# Patient Record
Sex: Female | Born: 1935 | Race: White | Hispanic: No | Marital: Single | Smoking: Never smoker
Health system: Southern US, Community
[De-identification: ages and names within clinical notes are randomized; demographics above are authoritative.]

## PROBLEM LIST (undated history)

## (undated) DIAGNOSIS — I10 Essential (primary) hypertension: Secondary | ICD-10-CM

## (undated) DIAGNOSIS — R42 Dizziness and giddiness: Secondary | ICD-10-CM

## (undated) DIAGNOSIS — J449 Chronic obstructive pulmonary disease, unspecified: Secondary | ICD-10-CM

## (undated) DIAGNOSIS — N189 Chronic kidney disease, unspecified: Secondary | ICD-10-CM

## (undated) DIAGNOSIS — F32A Depression, unspecified: Secondary | ICD-10-CM

## (undated) DIAGNOSIS — F329 Major depressive disorder, single episode, unspecified: Secondary | ICD-10-CM

## (undated) DIAGNOSIS — G2581 Restless legs syndrome: Secondary | ICD-10-CM

## (undated) DIAGNOSIS — I509 Heart failure, unspecified: Secondary | ICD-10-CM

## (undated) DIAGNOSIS — I38 Endocarditis, valve unspecified: Secondary | ICD-10-CM

## (undated) DIAGNOSIS — I4891 Unspecified atrial fibrillation: Secondary | ICD-10-CM

## (undated) DIAGNOSIS — F0390 Unspecified dementia without behavioral disturbance: Secondary | ICD-10-CM

## (undated) DIAGNOSIS — I2510 Atherosclerotic heart disease of native coronary artery without angina pectoris: Secondary | ICD-10-CM

## (undated) DIAGNOSIS — E785 Hyperlipidemia, unspecified: Secondary | ICD-10-CM

## (undated) DIAGNOSIS — Z950 Presence of cardiac pacemaker: Secondary | ICD-10-CM

## (undated) HISTORY — PX: PACEMAKER INSERTION: SHX728

---

## 2016-01-02 ENCOUNTER — Encounter (HOSPITAL_COMMUNITY): Payer: Self-pay | Admitting: Emergency Medicine

## 2016-01-02 ENCOUNTER — Emergency Department (HOSPITAL_COMMUNITY)
Admission: EM | Admit: 2016-01-02 | Discharge: 2016-01-03 | Disposition: A | Discharge status: Home / Self Care | Payer: Medicare Other | Attending: Emergency Medicine | Admitting: Emergency Medicine

## 2016-01-02 DIAGNOSIS — Z950 Presence of cardiac pacemaker: Secondary | ICD-10-CM | POA: Diagnosis not present

## 2016-01-02 DIAGNOSIS — Z7901 Long term (current) use of anticoagulants: Secondary | ICD-10-CM | POA: Diagnosis not present

## 2016-01-02 DIAGNOSIS — E876 Hypokalemia: Secondary | ICD-10-CM | POA: Diagnosis not present

## 2016-01-02 DIAGNOSIS — R197 Diarrhea, unspecified: Secondary | ICD-10-CM | POA: Diagnosis present

## 2016-01-02 DIAGNOSIS — E860 Dehydration: Secondary | ICD-10-CM | POA: Diagnosis not present

## 2016-01-02 DIAGNOSIS — Z79899 Other long term (current) drug therapy: Secondary | ICD-10-CM | POA: Diagnosis not present

## 2016-01-02 DIAGNOSIS — N390 Urinary tract infection, site not specified: Secondary | ICD-10-CM | POA: Diagnosis not present

## 2016-01-02 LAB — URINALYSIS, ROUTINE W REFLEX MICROSCOPIC
Bilirubin Urine: NEGATIVE
GLUCOSE, UA: NEGATIVE mg/dL
KETONES UR: NEGATIVE mg/dL
Nitrite: POSITIVE — AB
PH: 6 (ref 5.0–8.0)
Protein, ur: 100 mg/dL — AB
Specific Gravity, Urine: 1.02 (ref 1.005–1.030)

## 2016-01-02 LAB — CBC WITH DIFFERENTIAL/PLATELET
BASOS ABS: 0 10*3/uL (ref 0.0–0.1)
BASOS PCT: 0 %
EOS ABS: 0 10*3/uL (ref 0.0–0.7)
Eosinophils Relative: 0 %
HEMATOCRIT: 35.8 % — AB (ref 36.0–46.0)
Hemoglobin: 12.2 g/dL (ref 12.0–15.0)
Lymphocytes Relative: 26 %
Lymphs Abs: 0.9 10*3/uL (ref 0.7–4.0)
MCH: 31.6 pg (ref 26.0–34.0)
MCHC: 34.1 g/dL (ref 30.0–36.0)
MCV: 92.7 fL (ref 78.0–100.0)
MONOS PCT: 21 %
Monocytes Absolute: 0.8 10*3/uL (ref 0.1–1.0)
NEUTROS ABS: 1.9 10*3/uL (ref 1.7–7.7)
NEUTROS PCT: 53 %
Platelets: 125 10*3/uL — ABNORMAL LOW (ref 150–400)
RBC: 3.86 MIL/uL — AB (ref 3.87–5.11)
RDW: 14.8 % (ref 11.5–15.5)
WBC: 3.6 10*3/uL — AB (ref 4.0–10.5)

## 2016-01-02 LAB — URINE MICROSCOPIC-ADD ON: Squamous Epithelial / LPF: NONE SEEN

## 2016-01-02 LAB — COMPREHENSIVE METABOLIC PANEL
ALBUMIN: 3.6 g/dL (ref 3.5–5.0)
ALK PHOS: 31 U/L — AB (ref 38–126)
ALT: 12 U/L — ABNORMAL LOW (ref 14–54)
ANION GAP: 11 (ref 5–15)
AST: 27 U/L (ref 15–41)
BILIRUBIN TOTAL: 0.5 mg/dL (ref 0.3–1.2)
BUN: 25 mg/dL — AB (ref 6–20)
CALCIUM: 8.7 mg/dL — AB (ref 8.9–10.3)
CO2: 24 mmol/L (ref 22–32)
Chloride: 106 mmol/L (ref 101–111)
Creatinine, Ser: 1.39 mg/dL — ABNORMAL HIGH (ref 0.44–1.00)
GFR calc Af Amer: 38 mL/min — ABNORMAL LOW (ref >60)
GFR calc non Af Amer: 33 mL/min — ABNORMAL LOW (ref >60)
GLUCOSE: 128 mg/dL — AB (ref 65–99)
Potassium: 2.9 mmol/L — ABNORMAL LOW (ref 3.5–5.1)
SODIUM: 141 mmol/L (ref 135–145)
Total Protein: 6.7 g/dL (ref 6.5–8.1)

## 2016-01-02 MED ORDER — CEPHALEXIN 500 MG PO CAPS: 500.0000 mg | ORAL_CAPSULE | Freq: Two times a day (BID) | ORAL | Status: DC

## 2016-01-02 MED ORDER — POTASSIUM CHLORIDE 10 MEQ/100ML IV SOLN
10.0000 meq | INTRAVENOUS | Status: AC
  Administered 2016-01-02 (×3): 10 meq via INTRAVENOUS
  Filled 2016-01-02 (×3): qty 100

## 2016-01-02 MED ORDER — ACETAMINOPHEN 325 MG PO TABS
650.0000 mg | ORAL_TABLET | ORAL | Status: DC | PRN
  Administered 2016-01-03: 650 mg via ORAL
  Filled 2016-01-02: qty 2

## 2016-01-02 MED ORDER — SODIUM CHLORIDE 0.9 % IV BOLUS (SEPSIS)
500.0000 mL | Freq: Once | INTRAVENOUS | Status: AC
  Administered 2016-01-02: 500 mL via INTRAVENOUS

## 2016-01-02 MED ORDER — POTASSIUM CHLORIDE CRYS ER 20 MEQ PO TBCR
40.0000 meq | EXTENDED_RELEASE_TABLET | Freq: Once | ORAL | Status: AC
  Administered 2016-01-02: 40 meq via ORAL
  Filled 2016-01-02: qty 2

## 2016-01-02 MED ORDER — DEXTROSE 5 % IV SOLN
1.0000 g | Freq: Once | INTRAVENOUS | Status: AC
  Administered 2016-01-03: 1 g via INTRAVENOUS
  Filled 2016-01-02: qty 10

## 2016-01-02 NOTE — ED Notes (Signed)
Bed: [id number] Expected date:  Expected time:  Means of arrival:  Comments: EMS-diarrhea

## 2016-01-02 NOTE — Discharge Instructions (Signed)
Dehydration, Adult Dehydration is a condition in which you do not have enough fluid or water in your body. It happens when you take in less fluid than you lose. Vital organs such as the kidneys, brain, and heart cannot function without a proper amount of fluids. Any loss of fluids from the body can cause dehydration.  Dehydration can range from mild to severe. This condition should be treated right away to help prevent it from becoming severe. CAUSES  This condition may be caused by:  Vomiting.  Diarrhea.  Excessive sweating, such as when exercising in hot or humid weather.  Not drinking enough fluid during strenuous exercise or during an illness.  Excessive urine output.  Fever.  Certain medicines. RISK FACTORS This condition is more likely to develop in:  People who are taking certain medicines that cause the body to lose excess fluid (diuretics).   People who have a chronic illness, such as diabetes, that may increase urination.  Older adults.   People who live at high altitudes.   People who participate in endurance sports.  SYMPTOMS  Mild Dehydration  Thirst.  Dry lips.  Slightly dry mouth.  Dry, warm skin. Moderate Dehydration  Very dry mouth.   Muscle cramps.   Dark urine and decreased urine production.   Decreased tear production.   Headache.   Light-headedness, especially when you stand up from a sitting position.  Severe Dehydration  Changes in skin.   Cold and clammy skin.   Skin does not spring back quickly when lightly pinched and released.   Changes in body fluids.   Extreme thirst.   No tears.   Not able to sweat when body temperature is high, such as in hot weather.   Minimal urine production.   Changes in vital signs.   Rapid, weak pulse (more than 100 beats per minute when you are sitting still).   Rapid breathing.   Low blood pressure.   Other changes.   Sunken eyes.   Cold hands and feet.    Confusion.  Lethargy and difficulty being awakened.  Fainting (syncope).   Short-term weight loss.   Unconsciousness. DIAGNOSIS  This condition may be diagnosed based on your symptoms. You may also have tests to determine how severe your dehydration is. These tests may include:   Urine tests.   Blood tests.  TREATMENT  Treatment for this condition depends on the severity. Mild or moderate dehydration can often be treated at home. Treatment should be started right away. Do not wait until dehydration becomes severe. Severe dehydration needs to be treated at the hospital. Treatment for Mild Dehydration  Drinking plenty of water to replace the fluid you have lost.   Replacing minerals in your blood (electrolytes) that you may have lost.  Treatment for Moderate Dehydration  Consuming oral rehydration solution (ORS). Treatment for Severe Dehydration  Receiving fluid through an IV tube.   Receiving electrolyte solution through a feeding tube that is passed through your nose and into your stomach (nasogastric tube or NG tube).  Correcting any abnormalities in electrolytes. HOME CARE INSTRUCTIONS   Drink enough fluid to keep your urine clear or pale yellow.   Drink water or fluid slowly by taking small sips. You can also try sucking on ice cubes.  Have food or beverages that contain electrolytes. Examples include bananas and sports drinks.  Take over-the-counter and prescription medicines only as told by your health care provider.   Prepare ORS according to the manufacturer's instructions. Take sips  of ORS every 5 minutes until your urine returns to normal.  If you have vomiting or diarrhea, continue to try to drink water, ORS, or both.   If you have diarrhea, avoid:   Beverages that contain caffeine.   Fruit juice.   Milk.   Carbonated soft drinks.  Do not take salt tablets. This can lead to the condition of having too much sodium in your body  (hypernatremia).  SEEK MEDICAL CARE IF:  You cannot eat or drink without vomiting.  You have had moderate diarrhea during a period of more than 24 hours.  You have a fever. SEEK IMMEDIATE MEDICAL CARE IF:   You have extreme thirst.  You have severe diarrhea.  You have not urinated in 6-8 hours, or you have urinated only a small amount of very dark urine.  You have shriveled skin.  You are dizzy, confused, or both.   This information is not intended to replace advice given to you by your health care provider. Make sure you discuss any questions you have with your health care provider.   Document Released: 10/05/2005 Document Revised: 06/26/2015 Document Reviewed: 02/20/2015 Elsevier Interactive Patient Education 2016 [place].  Diarrhea Diarrhea is frequent loose and watery bowel movements. It can cause you to feel weak and dehydrated. Dehydration can cause you to become tired and thirsty, have a dry mouth, and have decreased urination that often is dark yellow. Diarrhea is a sign of another problem, most often an infection that will not last long. In most cases, diarrhea typically lasts 2-3 days. However, it can last longer if it is a sign of something more serious. It is important to treat your diarrhea as directed by your caregiver to lessen or prevent future episodes of diarrhea. CAUSES  Some common causes include:  Gastrointestinal infections caused by viruses, bacteria, or parasites.  Food poisoning or food allergies.  Certain medicines, such as antibiotics, chemotherapy, and laxatives.  Artificial sweeteners and fructose.  Digestive disorders. HOME CARE INSTRUCTIONS  Ensure adequate fluid intake (hydration): Have 1 cup (8 oz) of fluid for each diarrhea episode. Avoid fluids that contain simple sugars or sports drinks, fruit juices, whole milk products, and sodas. Your urine should be clear or pale yellow if you are drinking enough fluids. Hydrate with an oral  rehydration solution that you can purchase at pharmacies, retail stores, and online. You can prepare an oral rehydration solution at home by mixing the following ingredients together:   - tsp table salt.   tsp baking soda.   tsp salt substitute containing potassium chloride.  1  tablespoons sugar.  1 L (34 oz) of water.  Certain foods and beverages may increase the speed at which food moves through the gastrointestinal (GI) tract. These foods and beverages should be avoided and include:  Caffeinated and alcoholic beverages.  High-fiber foods, such as raw fruits and vegetables, nuts, seeds, and whole grain breads and cereals.  Foods and beverages sweetened with sugar alcohols, such as xylitol, sorbitol, and mannitol.  Some foods may be well tolerated and may help thicken stool including:  Starchy foods, such as rice, toast, pasta, low-sugar cereal, oatmeal, grits, baked potatoes, crackers, and bagels.  Bananas.  Applesauce.  Add probiotic-rich foods to help increase healthy bacteria in the GI tract, such as yogurt and fermented milk products.  Wash your hands well after each diarrhea episode.  Only take over-the-counter or prescription medicines as directed by your caregiver.  Take a warm bath to relieve any burning  or pain from frequent diarrhea episodes. SEEK IMMEDIATE MEDICAL CARE IF:   You are unable to keep fluids down.  You have persistent vomiting.  You have blood in your stool, or your stools are black and tarry.  You do not urinate in 6-8 hours, or there is only a small amount of very dark urine.  You have abdominal pain that increases or localizes.  You have weakness, dizziness, confusion, or light-headedness.  You have a severe headache.  Your diarrhea gets worse or does not get better.  You have a fever or persistent symptoms for more than 2-3 days.  You have a fever and your symptoms suddenly get worse. MAKE SURE YOU:   Understand these  instructions.  Will watch your condition.  Will get help right away if you are not doing well or get worse.   This information is not intended to replace advice given to you by your health care provider. Make sure you discuss any questions you have with your health care provider.   Document Released: 09/25/2002 Document Revised: 10/26/2014 Document Reviewed: 06/12/2012 Elsevier Interactive Patient Education 2016 [employer]  Hypokalemia Hypokalemia means that the amount of potassium in the blood is lower than normal.Potassium is a chemical, called an electrolyte, that helps regulate the amount of fluid in the body. It also stimulates muscle contraction and helps nerves function properly.Most of the body's potassium is inside of cells, and only a very small amount is in the blood. Because the amount in the blood is so small, minor changes can be life-threatening. CAUSES  Antibiotics.  Diarrhea or vomiting.  Using laxatives too much, which can cause diarrhea.  Chronic kidney disease.  Water pills (diuretics).  Eating disorders (bulimia).  Low magnesium level.  Sweating a lot. SIGNS AND SYMPTOMS  Weakness.  Constipation.  Fatigue.  Muscle cramps.  Mental confusion.  Skipped heartbeats or irregular heartbeat (palpitations).  Tingling or numbness. DIAGNOSIS  Your health care provider can diagnose hypokalemia with blood tests. In addition to checking your potassium level, your health care provider may also check other lab tests. TREATMENT Hypokalemia can be treated with potassium supplements taken by mouth or adjustments in your current medicines. If your potassium level is very low, you may need to get potassium through a vein (IV) and be monitored in the hospital. A diet high in potassium is also helpful. Foods high in potassium are:  Nuts, such as peanuts and pistachios.  Seeds, such as sunflower seeds and pumpkin seeds.  Peas, lentils, and lima beans.  Whole  grain and bran cereals and breads.  Fresh fruit and vegetables, such as apricots, avocado, bananas, cantaloupe, kiwi, oranges, tomatoes, asparagus, and potatoes.  Orange and tomato juices.  Red meats.  Fruit yogurt. HOME CARE INSTRUCTIONS  Take all medicines as prescribed by your health care provider.  Maintain a healthy diet by including nutritious food, such as fruits, vegetables, nuts, whole grains, and lean meats.  If you are taking a laxative, be sure to follow the directions on the label. SEEK MEDICAL CARE IF:  Your weakness gets worse.  You feel your heart pounding or racing.  You are vomiting or having diarrhea.  You are diabetic and having trouble keeping your blood glucose in the normal range. SEEK IMMEDIATE MEDICAL CARE IF:  You have chest pain, shortness of breath, or dizziness.  You are vomiting or having diarrhea for more than 2 days.  You faint. MAKE SURE YOU:   Understand these instructions.  Will watch your  condition.  Will get help right away if you are not doing well or get worse.   This information is not intended to replace advice given to you by your health care provider. Make sure you discuss any questions you have with your health care provider.   Document Released: 10/05/2005 Document Revised: 10/26/2014 Document Reviewed: 04/07/2013 Elsevier Interactive Patient Education [employer]vier Inc.

## 2016-01-02 NOTE — ED Notes (Signed)
[facility], c/o diarrhea X2days, no V/F, weakness, dizziness, BP 85/60, alert but oriented to person only,

## 2016-01-02 NOTE — Progress Notes (Signed)
Patient listed as having Medicare insurance without a pcp.  EDCM spoke to patient at bedside.  Patient reports she has a pcp, but she doesn't remember who it is.  Patient noted to be from [facility].  Doctor listed at facility is Dr. Florentina JennyHenry Tripp.  System updated.

## 2016-01-02 NOTE — ED Provider Notes (Signed)
CSN: [id number]     Arrival date & time 01/02/16  1844 History   First MD Initiated Contact with Patient 01/02/16 1857     Chief Complaint  Patient presents with  . Diarrhea     (Consider location/radiation/quality/duration/timing/severity/associated sxs/prior Treatment) HPI [facility] for diarrhea 2 days. Patient is unaware of how many time she's had loose bowel movements. She denies any nausea or vomiting. She denies abdominal pain or fever. She is alert but only oriented to person. Per EMS patient had a blood pressure of 85/65. She states that she normally runs a low blood pressure. She denies lightheadedness or dizziness. History reviewed. No pertinent past medical history. Past Surgical History  Procedure Laterality Date  . Pacemaker insertion     No family history on file. Social History  Substance Use Topics  . Smoking status: Never Smoker   . Smokeless tobacco: None  . Alcohol Use: Yes   OB History    No data available     Review of Systems  Constitutional: Negative for fever and chills.  Respiratory: Negative for shortness of breath.   Cardiovascular: Negative for chest pain.  Gastrointestinal: Positive for diarrhea. Negative for nausea, vomiting and abdominal pain.  Musculoskeletal: Negative for back pain and neck pain.  Skin: Negative for rash and wound.  Neurological: Negative for dizziness, syncope, weakness, light-headedness and headaches.  Psychiatric/Behavioral: Positive for confusion.  All other systems reviewed and are negative.     Allergies  Other  Home Medications   Prior to Admission medications   Medication Sig Start Date End Date Taking? Authorizing Provider  acetaminophen (TYLENOL) 325 MG tablet Take 650 mg by mouth every 4 (four) hours as needed for mild pain, moderate pain, fever or headache.   Yes Historical Provider, MD  alendronate (FOSAMAX) 70 MG tablet Take 70 mg by mouth once a week. Take with a full  glass of water on an empty stomach.   Yes Historical Provider, MD  cephALEXin (KEFLEX) 500 MG capsule Take 1 capsule (500 mg total) by mouth 2 (two) times daily. 01/02/16   Loren Raceravid Griselle Rufer, MD  cholecalciferol (VITAMIN D) 1000 units tablet Take 2,000 Units by mouth daily.   Yes Historical Provider, MD  citalopram (CELEXA) 10 MG tablet Take 10 mg by mouth daily.   Yes Historical Provider, MD  furosemide (LASIX) 20 MG tablet Take 20 mg by mouth daily.   Yes Historical Provider, MD  loperamide (IMODIUM A-D) 2 MG tablet Take 4 mg by mouth 4 (four) times daily as needed for diarrhea or loose stools.   Yes Historical Provider, MD  meclizine (ANTIVERT) 25 MG tablet Take 25 mg by mouth 3 (three) times daily.   Yes Historical Provider, MD  Melatonin 3 MG CAPS Take 3 mg by mouth at bedtime.   Yes Historical Provider, MD  nitroGLYCERIN (NITROSTAT) 0.4 MG SL tablet Place 0.4 mg under the tongue every 5 (five) minutes as needed for chest pain.   Yes Historical Provider, MD  polyethylene glycol (MIRALAX / GLYCOLAX) packet Take 17 g by mouth daily as needed for mild constipation or moderate constipation. Mix 17 gm in 8 oz of liquid   Yes Historical Provider, MD  pravastatin (PRAVACHOL) 20 MG tablet Take 20 mg by mouth at bedtime.   Yes Historical Provider, MD  PRESCRIPTION MEDICATION Apply 1 application topically every 8 (eight) hours as needed (pain, itching). Menthol-Camphor-- apply to right shoulder   Yes Historical Provider, MD  vitamin B-12 (  CYANOCOBALAMIN) 1000 MCG tablet Take 1,000 mcg by mouth daily.   Yes Historical Provider, MD  warfarin (COUMADIN) 4 MG tablet Take 4 mg by mouth every evening.   Yes Historical Provider, MD   BP 110/67 mmHg  Pulse 88  Temp(Src) 98.3 F (36.8 C) (Oral)  Resp 20  Wt 150 lb (68.04 kg)  SpO2 95% Physical Exam  Constitutional: She appears well-developed and well-nourished. No distress.  HENT:  Head: Normocephalic and atraumatic.  Mouth/Throat: Oropharynx is clear and  moist.  Eyes: EOM are normal. Pupils are equal, round, and reactive to light.  Neck: Normal range of motion. Neck supple.  Cardiovascular: Normal rate and regular rhythm.   Pulmonary/Chest: Effort normal and breath sounds normal. No respiratory distress. She has no wheezes. She has no rales.  Abdominal: Soft. Bowel sounds are normal. She exhibits no distension and no mass. There is no tenderness. There is no rebound and no guarding.  Musculoskeletal: Normal range of motion. She exhibits no edema or tenderness.  Neurological: She is alert.  Oriented to self. 5/5 motor in all extremities. Sensation is fully intact.  Skin: Skin is warm and dry. No rash noted. No erythema.  Psychiatric: She has a normal mood and affect. Her behavior is normal.  Nursing note and vitals reviewed.   ED Course  Procedures (including critical care time) Labs Review Labs Reviewed  CBC WITH DIFFERENTIAL/PLATELET - Abnormal; Notable for the following:    WBC 3.6 (*)    RBC 3.86 (*)    HCT 35.8 (*)    Platelets 125 (*)    All other components within normal limits  COMPREHENSIVE METABOLIC PANEL - Abnormal; Notable for the following:    Potassium 2.9 (*)    Glucose, Bld 128 (*)    BUN 25 (*)    Creatinine, Ser 1.39 (*)    Calcium 8.7 (*)    ALT 12 (*)    Alkaline Phosphatase 31 (*)    GFR calc non Af Amer 33 (*)    GFR calc Af Amer 38 (*)    All other components within normal limits  URINALYSIS, ROUTINE W REFLEX MICROSCOPIC (NOT AT [facility]) - Abnormal; Notable for the following:    APPearance TURBID (*)    Hgb urine dipstick MODERATE (*)    Protein, ur 100 (*)    Nitrite POSITIVE (*)    Leukocytes, UA LARGE (*)    All other components within normal limits  PROTIME-INR - Abnormal; Notable for the following:    Prothrombin Time 27.3 (*)    INR 2.58 (*)    All other components within normal limits  URINE MICROSCOPIC-ADD ON - Abnormal; Notable for the following:    Bacteria, UA MANY (*)    All other  components within normal limits    Imaging Review No results found. I have personally reviewed and evaluated these images and lab results as part of my medical decision-making.   EKG Interpretation None      MDM   Final diagnoses:  Diarrhea, unspecified type  Dehydration  Hypokalemia  UTI (lower urinary tract infection)   Patient appears to be at her mental baseline. Blood pressures improved with IV fluids. We'll give by mouth and IV potassium replacement.  Signed out to oncoming emergency physician pending potassium replacement. Patient is ambulating steadily. Vital signs remained stable.  Loren Racer, MD 01/03/16 [phone]

## 2016-01-02 NOTE — ED Notes (Signed)
Assisted patient to restroom and back to stretcher. Pt had a few drops of stool but not enough to count.

## 2016-01-03 DIAGNOSIS — E860 Dehydration: Secondary | ICD-10-CM | POA: Diagnosis not present

## 2016-01-03 LAB — PROTIME-INR
INR: 2.58 — AB (ref 0.00–1.49)
PROTHROMBIN TIME: 27.3 s — AB (ref 11.6–15.2)

## 2016-01-03 NOTE — ED Notes (Signed)
Notified [facility] of transportation.  

## 2016-05-07 ENCOUNTER — Emergency Department (HOSPITAL_COMMUNITY): Payer: Medicare Other

## 2016-05-07 ENCOUNTER — Emergency Department (HOSPITAL_COMMUNITY)
Admission: EM | Admit: 2016-05-07 | Discharge: 2016-05-07 | Disposition: A | Discharge status: Home / Self Care | Payer: Medicare Other | Attending: Emergency Medicine | Admitting: Emergency Medicine

## 2016-05-07 ENCOUNTER — Encounter (HOSPITAL_COMMUNITY): Payer: Self-pay | Admitting: *Deleted

## 2016-05-07 DIAGNOSIS — N189 Chronic kidney disease, unspecified: Secondary | ICD-10-CM | POA: Insufficient documentation

## 2016-05-07 DIAGNOSIS — Y939 Activity, unspecified: Secondary | ICD-10-CM | POA: Insufficient documentation

## 2016-05-07 DIAGNOSIS — S060X0A Concussion without loss of consciousness, initial encounter: Secondary | ICD-10-CM | POA: Diagnosis not present

## 2016-05-07 DIAGNOSIS — Z79899 Other long term (current) drug therapy: Secondary | ICD-10-CM | POA: Insufficient documentation

## 2016-05-07 DIAGNOSIS — J449 Chronic obstructive pulmonary disease, unspecified: Secondary | ICD-10-CM | POA: Diagnosis not present

## 2016-05-07 DIAGNOSIS — Y929 Unspecified place or not applicable: Secondary | ICD-10-CM | POA: Diagnosis not present

## 2016-05-07 DIAGNOSIS — I2510 Atherosclerotic heart disease of native coronary artery without angina pectoris: Secondary | ICD-10-CM | POA: Insufficient documentation

## 2016-05-07 DIAGNOSIS — S73101A Unspecified sprain of right hip, initial encounter: Secondary | ICD-10-CM | POA: Diagnosis not present

## 2016-05-07 DIAGNOSIS — I509 Heart failure, unspecified: Secondary | ICD-10-CM | POA: Insufficient documentation

## 2016-05-07 DIAGNOSIS — W228XXA Striking against or struck by other objects, initial encounter: Secondary | ICD-10-CM | POA: Diagnosis not present

## 2016-05-07 DIAGNOSIS — I130 Hypertensive heart and chronic kidney disease with heart failure and stage 1 through stage 4 chronic kidney disease, or unspecified chronic kidney disease: Secondary | ICD-10-CM | POA: Diagnosis not present

## 2016-05-07 DIAGNOSIS — Z7901 Long term (current) use of anticoagulants: Secondary | ICD-10-CM | POA: Insufficient documentation

## 2016-05-07 DIAGNOSIS — S79911A Unspecified injury of right hip, initial encounter: Secondary | ICD-10-CM | POA: Diagnosis present

## 2016-05-07 DIAGNOSIS — Z950 Presence of cardiac pacemaker: Secondary | ICD-10-CM | POA: Insufficient documentation

## 2016-05-07 DIAGNOSIS — Y999 Unspecified external cause status: Secondary | ICD-10-CM | POA: Insufficient documentation

## 2016-05-07 DIAGNOSIS — W19XXXA Unspecified fall, initial encounter: Secondary | ICD-10-CM

## 2016-05-07 HISTORY — DX: Dizziness and giddiness: R42

## 2016-05-07 HISTORY — DX: Restless legs syndrome: G25.81

## 2016-05-07 HISTORY — DX: Major depressive disorder, single episode, unspecified: F32.9

## 2016-05-07 HISTORY — DX: Endocarditis, valve unspecified: I38

## 2016-05-07 HISTORY — DX: Presence of cardiac pacemaker: Z95.0

## 2016-05-07 HISTORY — DX: Hyperlipidemia, unspecified: E78.5

## 2016-05-07 HISTORY — DX: Essential (primary) hypertension: I10

## 2016-05-07 HISTORY — DX: Depression, unspecified: F32.A

## 2016-05-07 HISTORY — DX: Atherosclerotic heart disease of native coronary artery without angina pectoris: I25.10

## 2016-05-07 HISTORY — DX: Heart failure, unspecified: I50.9

## 2016-05-07 HISTORY — DX: Unspecified dementia, unspecified severity, without behavioral disturbance, psychotic disturbance, mood disturbance, and anxiety: F03.90

## 2016-05-07 HISTORY — DX: Chronic obstructive pulmonary disease, unspecified: J44.9

## 2016-05-07 HISTORY — DX: Unspecified atrial fibrillation: I48.91

## 2016-05-07 HISTORY — DX: Chronic kidney disease, unspecified: N18.9

## 2016-05-07 LAB — PROTIME-INR
INR: 2.56 — AB (ref 0.00–1.49)
Prothrombin Time: 27.2 seconds — ABNORMAL HIGH (ref 11.6–15.2)

## 2016-05-07 NOTE — ED Provider Notes (Signed)
CSN: [id number]     Arrival date & time 05/07/16  0054 History  By signing my name below, I, Freida Busman, attest that this documentation has been prepared under the direction and in the presence of Zadie Rhine, MD . Electronically Signed: Freida Busman, Scribe. 05/07/2016. 1:21 AM.  Chief Complaint  Patient presents with  . Fall   LEVEL 5 CAVEAT DUE TO Dementia   The history is provided by the patient. No language interpreter was used.     HPI Comments:  Koren Plyler is a 82 y.o. female with a history of dementia, who presents to the Emergency Department s/p fall complaining of pain to the top of her head; notes a  lump to the same area. She also notes pain to her leg. She denies CP, back pain, and abdominal pain. No alleviating factors noted. Pt is currently on coumadin. Pt arrives to the ED via EMS from [facility]. Per triage note pt fell and struck her head and is at her baseline mentation per NH.    Past Medical History  Diagnosis Date  . Dementia   . Depression   . Pacemaker   . A-fib (HCC)   . Dyslipidemia   . CKD (chronic kidney disease)   . Valvular heart disease   . COPD (chronic obstructive pulmonary disease) (HCC)   . Coronary artery disease   . Hypertension   . CHF (congestive heart failure) (HCC)   . Vertigo   . RLS (restless legs syndrome)    Past Surgical History  Procedure Laterality Date  . Pacemaker insertion     No family history on file. Social History  Substance Use Topics  . Smoking status: Never Smoker   . Smokeless tobacco: None  . Alcohol Use: Yes   OB History    No data available     Review of Systems  Unable to perform ROS: Dementia    Allergies  Other  Home Medications   Prior to Admission medications   Medication Sig Start Date End Date Taking? Authorizing Provider  acetaminophen (TYLENOL) 325 MG tablet Take 650 mg by mouth every 4 (four) hours as needed for mild pain, moderate pain, fever or headache.     Historical Provider, MD  alendronate (FOSAMAX) 70 MG tablet Take 70 mg by mouth once a week. Take with a full glass of water on an empty stomach.    Historical Provider, MD  cephALEXin (KEFLEX) 500 MG capsule Take 1 capsule (500 mg total) by mouth 2 (two) times daily. 01/02/16   Loren Racer, MD  cholecalciferol (VITAMIN D) 1000 units tablet Take 2,000 Units by mouth daily.    Historical Provider, MD  citalopram (CELEXA) 10 MG tablet Take 10 mg by mouth daily.    Historical Provider, MD  furosemide (LASIX) 20 MG tablet Take 20 mg by mouth daily.    Historical Provider, MD  loperamide (IMODIUM A-D) 2 MG tablet Take 4 mg by mouth 4 (four) times daily as needed for diarrhea or loose stools.    Historical Provider, MD  meclizine (ANTIVERT) 25 MG tablet Take 25 mg by mouth 3 (three) times daily.    Historical Provider, MD  Melatonin 3 MG CAPS Take 3 mg by mouth at bedtime.    Historical Provider, MD  nitroGLYCERIN (NITROSTAT) 0.4 MG SL tablet Place 0.4 mg under the tongue every 5 (five) minutes as needed for chest pain.    Historical Provider, MD  polyethylene glycol (MIRALAX / GLYCOLAX) packet Take 17 g  by mouth daily as needed for mild constipation or moderate constipation. Mix 17 gm in 8 oz of liquid    Historical Provider, MD  pravastatin (PRAVACHOL) 20 MG tablet Take 20 mg by mouth at bedtime.    Historical Provider, MD  PRESCRIPTION MEDICATION Apply 1 application topically every 8 (eight) hours as needed (pain, itching). Menthol-Camphor-- apply to right shoulder    Historical Provider, MD  vitamin B-12 (CYANOCOBALAMIN) 1000 MCG tablet Take 1,000 mcg by mouth daily.    Historical Provider, MD  warfarin (COUMADIN) 4 MG tablet Take 4 mg by mouth every evening.    Historical Provider, MD   BP 124/71 mmHg  Pulse 83  Temp(Src) 98 F (36.7 C) (Oral)  Resp 20  SpO2 100% Physical Exam CONSTITUTIONAL: Elderly and frail HEAD: Tenderness and hematoma noted to scalp; no other signs of trauma  EYES:  EOMI/PERRL ENMT: Mucous membranes moist, No evidence of facial/nasal trauma No tenderness/bruising to mastoids NECK: supple no meningeal signs SPINE/BACK:entire spine nontender, kyphotic spine CV: S1/S2 noted, no murmurs/rubs/gallops noted LUNGS: Lungs are clear to auscultation bilaterally, no apparent distress ABDOMEN: soft, nontender, no rebound or guarding, bowel sounds noted throughout abdomen NEURO: Pt is awake/alert, moves all extremitiesx4.  No facial droop.  Pleasantly confused. EXTREMITIES: pulses normal/equal, full ROM Tenderness noted with ROM of right hip pelvis stable, All other extremities/joints palpated/ranged and nontender SKIN: warm, color normal  ED Course  Procedures  DIAGNOSTIC STUDIES:  Oxygen Saturation is 100% on RA, normal by my interpretation.     Labs Review Labs Reviewed  PROTIME-INR - Abnormal; Notable for the following:    Prothrombin Time 27.2 (*)    INR 2.56 (*)    All other components within normal limits    Imaging Review Ct Head Wo Contrast  05/07/2016  CLINICAL DATA:  Pain after trauma EXAM: CT HEAD WITHOUT CONTRAST TECHNIQUE: Contiguous axial images were obtained from the base of the skull through the vertex without intravenous contrast. COMPARISON:  None. FINDINGS: Paranasal sinuses and left mastoid air cells are normal. The left middle ear is well aerated. There is opacification of the right mastoid air cells with fluid extending into the right middle ear as well. No fractures identified in this region. There is a lucency through the anterior arch of C1 which appears to be well corticated and is favored to be chronic with no associated soft tissue swelling. The bones are otherwise unremarkable. Extracranial soft tissues are normal. No subdural, epidural, or subarachnoid hemorrhage. No mass, mass effect, or midline shift. Ventricles and sulci are prominent, likely due the age related atrophy. White matter changes are noted. No acute cortical ischemia  or infarct. The basal cisterns are patent. No other acute abnormalities identified. Low-attenuation the right-sided cerebellum on image 9 appears to be symmetric based on coronal images and the difference between the right and the left on axial imaging is favored to be due to slice selection. IMPRESSION: 1. Opacification of the right mastoid air cells with fluid in the right mid ear. No fractures identified in this region. The findings are nonspecific but may not be related to the patient's trauma. Recommend clinical correlation to exclude point tenderness in this region. 2. No other acute abnormalities. Electronically Signed   By: Gerome Samavid  Williams III M.D   On: 05/07/2016 03:37   Dg Hip Unilat With Pelvis 2-3 Views Right  05/07/2016  CLINICAL DATA:  Fall with right hip pain. Initial encounter. EXAM: DG HIP (WITH OR WITHOUT PELVIS) 2-3V RIGHT COMPARISON:  None. FINDINGS: No evidence of hip fracture or dislocation. No pelvic ring fracture or diastasis. Osteopenia. Mild hip degenerative spurring. Lower lumbar spondylosis with bulky leftward spurring. Coarse 2 cm right pelvic calcification, usually from fibroid. IMPRESSION: No acute finding. Electronically Signed   By: Marnee Spring M.D.   On: 05/07/2016 02:06   I have personally reviewed and evaluated these images and lab results as part of my medical decision-making.  4:11 AM Spoke to [employer], I spoke to [employer] Pt walking without walker and she fell No LOC No other acute issues  4:43 AM Pt able to stand and has no pain She has no new complaints She was stable in the ED INR is therapeutic Appropriate for d/c back to facility MDM   Final diagnoses:  Fall, initial encounter  Concussion, without loss of consciousness, initial encounter  Hip sprain, right, initial encounter    Nursing notes including past medical history and social history reviewed and considered in documentation xrays/imaging reviewed by myself and considered during  evaluation Labs/vital reviewed myself and considered during evaluation   I personally performed the services described in this documentation, which was scribed in my presence. The recorded information has been reviewed and is accurate.       Zadie Rhine, MD 05/07/16 [phone]

## 2016-05-07 NOTE — ED Notes (Signed)
Pt to Ct at this time.

## 2016-05-07 NOTE — ED Notes (Signed)
Pt arrives via EMS from [facility]. Pt fell and hit her head, homatoma to left head. Pt takes coumadin. Pt was able to ambulate post fall. Pt has hx dementia. At baseline per facility.

## 2016-05-07 NOTE — Discharge Instructions (Signed)
You have had a head injury which does not appear to require admission at this time. A concussion is a state of changed mental ability from trauma.  SEEK IMMEDIATE MEDICAL ATTENTION IF: There is confusion or drowsiness  You cannot awaken the injured person.  There is nausea (feeling sick to your stomach) or continued, forceful vomiting.  You notice dizziness or unsteadiness which is getting worse, or inability to walk.  You have convulsions or unconsciousness.  You cannot use arms or legs normally.  There are changes in pupil sizes. (This is the black center in the colored part of the eye)  There is clear or bloody discharge from the nose or ears.  Change in speech, vision, swallowing, or understanding.  Localized weakness, numbness, tingling, or change in bowel or bladder control.

## 2017-06-10 ENCOUNTER — Encounter (HOSPITAL_COMMUNITY): Payer: Self-pay

## 2017-06-10 ENCOUNTER — Emergency Department (HOSPITAL_COMMUNITY)
Admission: EM | Admit: 2017-06-10 | Discharge: 2017-06-10 | Disposition: A | Discharge status: Home / Self Care | Payer: Medicare Other | Attending: Emergency Medicine | Admitting: Emergency Medicine

## 2017-06-10 DIAGNOSIS — I2510 Atherosclerotic heart disease of native coronary artery without angina pectoris: Secondary | ICD-10-CM | POA: Diagnosis not present

## 2017-06-10 DIAGNOSIS — I509 Heart failure, unspecified: Secondary | ICD-10-CM | POA: Insufficient documentation

## 2017-06-10 DIAGNOSIS — Z950 Presence of cardiac pacemaker: Secondary | ICD-10-CM | POA: Diagnosis not present

## 2017-06-10 DIAGNOSIS — N189 Chronic kidney disease, unspecified: Secondary | ICD-10-CM | POA: Insufficient documentation

## 2017-06-10 DIAGNOSIS — J449 Chronic obstructive pulmonary disease, unspecified: Secondary | ICD-10-CM | POA: Diagnosis not present

## 2017-06-10 DIAGNOSIS — I130 Hypertensive heart and chronic kidney disease with heart failure and stage 1 through stage 4 chronic kidney disease, or unspecified chronic kidney disease: Secondary | ICD-10-CM | POA: Diagnosis not present

## 2017-06-10 DIAGNOSIS — R197 Diarrhea, unspecified: Secondary | ICD-10-CM

## 2017-06-10 DIAGNOSIS — N3000 Acute cystitis without hematuria: Secondary | ICD-10-CM | POA: Diagnosis not present

## 2017-06-10 LAB — URINALYSIS, ROUTINE W REFLEX MICROSCOPIC
BILIRUBIN URINE: NEGATIVE
Glucose, UA: NEGATIVE mg/dL
Ketones, ur: NEGATIVE mg/dL
NITRITE: NEGATIVE
PH: 6 (ref 5.0–8.0)
Protein, ur: NEGATIVE mg/dL
SPECIFIC GRAVITY, URINE: 1.004 — AB (ref 1.005–1.030)

## 2017-06-10 LAB — COMPREHENSIVE METABOLIC PANEL
ALBUMIN: 3.4 g/dL — AB (ref 3.5–5.0)
ALK PHOS: 37 U/L — AB (ref 38–126)
ALT: 10 U/L — ABNORMAL LOW (ref 14–54)
ANION GAP: 7 (ref 5–15)
AST: 20 U/L (ref 15–41)
BUN: 18 mg/dL (ref 6–20)
CALCIUM: 8.8 mg/dL — AB (ref 8.9–10.3)
CO2: 25 mmol/L (ref 22–32)
Chloride: 108 mmol/L (ref 101–111)
Creatinine, Ser: 0.95 mg/dL (ref 0.44–1.00)
GFR calc Af Amer: 59 mL/min — ABNORMAL LOW (ref >60)
GFR, EST NON AFRICAN AMERICAN: 51 mL/min — AB (ref >60)
GLUCOSE: 93 mg/dL (ref 65–99)
Potassium: 3.9 mmol/L (ref 3.5–5.1)
Sodium: 140 mmol/L (ref 135–145)
Total Bilirubin: 0.5 mg/dL (ref 0.3–1.2)
Total Protein: 6.8 g/dL (ref 6.5–8.1)

## 2017-06-10 LAB — CBC WITH DIFFERENTIAL/PLATELET
BASOS ABS: 0 10*3/uL (ref 0.0–0.1)
BASOS PCT: 0 %
Eosinophils Absolute: 0.1 10*3/uL (ref 0.0–0.7)
Eosinophils Relative: 3 %
HCT: 32.5 % — ABNORMAL LOW (ref 36.0–46.0)
HEMOGLOBIN: 10.6 g/dL — AB (ref 12.0–15.0)
Lymphocytes Relative: 26 %
Lymphs Abs: 1.4 10*3/uL (ref 0.7–4.0)
MCH: 32.3 pg (ref 26.0–34.0)
MCHC: 32.6 g/dL (ref 30.0–36.0)
MCV: 99.1 fL (ref 78.0–100.0)
MONO ABS: 0.5 10*3/uL (ref 0.1–1.0)
MONOS PCT: 10 %
NEUTROS PCT: 61 %
Neutro Abs: 3.3 10*3/uL (ref 1.7–7.7)
Platelets: 241 10*3/uL (ref 150–400)
RBC: 3.28 MIL/uL — ABNORMAL LOW (ref 3.87–5.11)
RDW: 14.5 % (ref 11.5–15.5)
WBC: 5.4 10*3/uL (ref 4.0–10.5)

## 2017-06-10 LAB — LIPASE, BLOOD: LIPASE: 27 U/L (ref 11–51)

## 2017-06-10 MED ORDER — DEXTROSE 5 % IV SOLN
1.0000 g | Freq: Once | INTRAVENOUS | Status: AC
  Administered 2017-06-10: 1 g via INTRAVENOUS
  Filled 2017-06-10: qty 10

## 2017-06-10 MED ORDER — CEPHALEXIN 500 MG PO CAPS: 500.0000 mg | ORAL_CAPSULE | Freq: Two times a day (BID) | ORAL | 0 refills | Status: AC

## 2017-06-10 NOTE — ED Provider Notes (Signed)
Emergency Department Provider Note   I have reviewed the triage vital signs and the nursing notes.   HISTORY  Chief Complaint Diarrhea  Level 5 caveat: Dementia  HPI Mikayla Boyd is a 83 y.o. female with PMH of Dementia, COPD, CKD, CHF, A-fib, HTN, HLD currently a resident at [facility] presents to the emergency department for evaluation of diarrhea. She reportedly had diarrhea for 2 days starting on 8/17 and continuing to 8/19. She has not had diarrhea since. The caretaker is requesting evaluation. Patient denies any pain. She denies any dysuria, hesitancy, urgency. History is significantly limited by her underlying dementia. No family or caretaker at bedside during my evaluation.    Past Medical History:  Diagnosis Date  . A-fib (HCC)   . CHF (congestive heart failure) (HCC)   . CKD (chronic kidney disease)   . COPD (chronic obstructive pulmonary disease) (HCC)   . Coronary artery disease   . Dementia   . Depression   . Dyslipidemia   . Hypertension   . Pacemaker   . RLS (restless legs syndrome)   . Valvular heart disease   . Vertigo     There are no active problems to display for this patient.   Past Surgical History:  Procedure Laterality Date  . PACEMAKER INSERTION      Current Outpatient Rx  . Order #: [id number] Class: Historical Med  . Order #: [id number] Class: Historical Med  . Order #: [id number] Class: Historical Med  . Order #: [id number] Class: Historical Med  . Order #: [id number] Class: Historical Med  . Order #: [id number] Class: Historical Med  . Order #: [id number] Class: Historical Med  . Order #: [id number] Class: Historical Med  . Order #: [id number] Class: Historical Med  . Order #: [id number] Class: Historical Med  . Order #: [id number] Class: Historical Med  . Order #: [id number] Class: Historical Med  . Order #: [id number] Class: Historical Med  . Order #: [id number] Class: Historical Med  . Order #: [id number] Class: Historical Med  . Order #:  [id number] Class: Historical Med  . Order #: [id number] Class: Historical Med  . Order #: [id number] Class: Print    Allergies Patient has no known allergies.  No family history on file.  Social History Social History  Substance Use Topics  . Smoking status: Never Smoker  . Smokeless tobacco: Not on file  . Alcohol use Yes    Review of Systems  Level 5 caveat: Dementia.   ____________________________________________   PHYSICAL EXAM:  VITAL SIGNS: ED Triage Vitals [06/10/17 1120]  Enc Vitals Group     BP 119/63     Pulse Rate 70     Resp 18     Temp 98.2 F (36.8 C)     Temp Source Oral     SpO2 96 %    Constitutional: Alert. Well appearing and making jokes during my interview.  Eyes: Conjunctivae are normal.  Head: Atraumatic. Nose: No congestion/rhinnorhea. Mouth/Throat: Mucous membranes are moist. Neck: No stridor.  Cardiovascular: Normal rate, regular rhythm. Good peripheral circulation. Grossly normal heart sounds.   Respiratory: Normal respiratory effort.  No retractions. Lungs CTAB. Gastrointestinal: Soft and nontender. No distention.  Musculoskeletal: No lower extremity tenderness nor edema. No gross deformities of extremities. Neurologic:  Normal speech and language. No gross focal neurologic deficits are appreciated.  Skin:  Skin is warm, dry and intact. No rash noted.  ____________________________________________   LABS (all labs ordered are listed, but only abnormal results are displayed)  Labs Reviewed  COMPREHENSIVE METABOLIC PANEL - Abnormal; Notable for the following:  Result Value   Calcium 8.8 (*)    Albumin 3.4 (*)    ALT 10 (*)    Alkaline Phosphatase 37 (*)    GFR calc non Af Amer 51 (*)    GFR calc Af Amer 59 (*)    All other components within normal limits  CBC WITH DIFFERENTIAL/PLATELET - Abnormal; Notable for the following:    RBC 3.28 (*)    Hemoglobin 10.6 (*)    HCT 32.5 (*)    All other components within normal limits    URINALYSIS, ROUTINE W REFLEX MICROSCOPIC - Abnormal; Notable for the following:    Color, Urine STRAW (*)    APPearance HAZY (*)    Specific Gravity, Urine 1.004 (*)    Hgb urine dipstick MODERATE (*)    Leukocytes, UA LARGE (*)    Bacteria, UA RARE (*)    Squamous Epithelial / LPF 0-5 (*)    All other components within normal limits  URINE CULTURE  LIPASE, BLOOD   ____________________________________________  RADIOLOGY  None ____________________________________________   PROCEDURES  Procedure(s) performed:   Procedures  None ____________________________________________   INITIAL IMPRESSION / ASSESSMENT AND PLAN / ED COURSE  Pertinent labs & imaging results that were available during my care of the patient were reviewed by me and considered in my medical decision making (see chart for details).  Patient presents to the emergency for evaluation of diarrhea several days ago. By report this is stopped. She is in no acute distress. Normal vital signs. Appears well hydrated clinically. Plan for baseline lab work to evaluate for underlying dehydration or electrolyte abnormality.   Patient with UTI. At her mental status baseline with no evidence of urosepsis. Plan for Rocephin in the ED and discharge on Keflex. No diarrhea in the ED. No diarrhea in the last several days.   At this time, I do not feel there is any life-threatening condition present. I have reviewed and discussed all results (EKG, imaging, lab, urine as appropriate), exam findings with patient. I have reviewed nursing notes and appropriate previous records.  I feel the patient is safe to be discharged home without further emergent workup. Discussed usual and customary return precautions. Patient and family (if present) verbalize understanding and are comfortable with this plan.  Patient will follow-up with their primary care provider. If they do not have a primary care provider, information for follow-up has been  provided to them. All questions have been answered.  ____________________________________________  FINAL CLINICAL IMPRESSION(S) / ED DIAGNOSES  Final diagnoses:  Acute cystitis without hematuria  Diarrhea, unspecified type     MEDICATIONS GIVEN DURING THIS VISIT:  Medications  cefTRIAXone (ROCEPHIN) 1 g in dextrose 5 % 50 mL IVPB (0 g Intravenous Stopped 06/10/17 1503)     NEW OUTPATIENT MEDICATIONS STARTED DURING THIS VISIT:  Discharge Medication List as of 06/10/2017  2:16 PM    START taking these medications   Details  cephALEXin (KEFLEX) 500 MG capsule Take 1 capsule (500 mg total) by mouth 2 (two) times daily., Starting Thu 06/10/2017, Until Thu 06/17/2017, Print          Note:  This document was prepared using Dragon voice recognition software and may include unintentional dictation errors.  Alona Bene, MD Emergency Medicine    Long, Arlyss Repress, MD 06/10/17 [phone]

## 2017-06-10 NOTE — ED Notes (Signed)
Bedside commode placed at patient's bedside. Assisted patient to use BC.

## 2017-06-10 NOTE — ED Notes (Signed)
ED Provider at bedside. 

## 2017-06-10 NOTE — ED Notes (Signed)
Patient's son, Loraine Leriche, requesting updates. [phone]

## 2017-06-10 NOTE — ED Triage Notes (Signed)
Pt arrives today by EMS from [facility]. EMS states pt experienced diarrhea Friday 8/17- Sunday 8/19. No diarrhea since Sunday, care taker at [employer] Son was adamant about pt being evaluated. Pt denies pain, N/V/D. EMS VS: 110/82, HR 84, RR 16, CBG 89

## 2017-06-10 NOTE — ED Notes (Signed)
Pt's son called and made aware of pt's discharge

## 2017-06-10 NOTE — ED Notes (Signed)
[facility] called for transport.  

## 2017-06-10 NOTE — ED Notes (Signed)
Bed: WA02 Expected date:  Expected time:  Means of arrival:  Comments: EMS-diarrhea 

## 2017-06-10 NOTE — Discharge Instructions (Signed)

## 2017-06-11 LAB — URINE CULTURE: SPECIAL REQUESTS: NORMAL

## 2017-07-15 ENCOUNTER — Emergency Department (HOSPITAL_COMMUNITY): Payer: Medicare Other

## 2017-07-15 ENCOUNTER — Encounter (HOSPITAL_COMMUNITY): Payer: Self-pay | Admitting: Pharmacy Technician

## 2017-07-15 ENCOUNTER — Emergency Department (HOSPITAL_COMMUNITY)
Admission: EM | Admit: 2017-07-15 | Discharge: 2017-07-16 | Disposition: A | Discharge status: Home / Self Care | Payer: Medicare Other | Attending: Emergency Medicine | Admitting: Emergency Medicine

## 2017-07-15 DIAGNOSIS — Z7901 Long term (current) use of anticoagulants: Secondary | ICD-10-CM | POA: Insufficient documentation

## 2017-07-15 DIAGNOSIS — Z79899 Other long term (current) drug therapy: Secondary | ICD-10-CM | POA: Insufficient documentation

## 2017-07-15 DIAGNOSIS — Z043 Encounter for examination and observation following other accident: Secondary | ICD-10-CM | POA: Diagnosis not present

## 2017-07-15 DIAGNOSIS — Y999 Unspecified external cause status: Secondary | ICD-10-CM | POA: Diagnosis not present

## 2017-07-15 DIAGNOSIS — I509 Heart failure, unspecified: Secondary | ICD-10-CM | POA: Diagnosis not present

## 2017-07-15 DIAGNOSIS — J449 Chronic obstructive pulmonary disease, unspecified: Secondary | ICD-10-CM | POA: Insufficient documentation

## 2017-07-15 DIAGNOSIS — Y9384 Activity, sleeping: Secondary | ICD-10-CM | POA: Insufficient documentation

## 2017-07-15 DIAGNOSIS — W06XXXA Fall from bed, initial encounter: Secondary | ICD-10-CM | POA: Diagnosis not present

## 2017-07-15 DIAGNOSIS — F0390 Unspecified dementia without behavioral disturbance: Secondary | ICD-10-CM | POA: Insufficient documentation

## 2017-07-15 DIAGNOSIS — W19XXXA Unspecified fall, initial encounter: Secondary | ICD-10-CM

## 2017-07-15 DIAGNOSIS — Z950 Presence of cardiac pacemaker: Secondary | ICD-10-CM | POA: Diagnosis not present

## 2017-07-15 DIAGNOSIS — N189 Chronic kidney disease, unspecified: Secondary | ICD-10-CM | POA: Diagnosis not present

## 2017-07-15 DIAGNOSIS — I130 Hypertensive heart and chronic kidney disease with heart failure and stage 1 through stage 4 chronic kidney disease, or unspecified chronic kidney disease: Secondary | ICD-10-CM | POA: Diagnosis not present

## 2017-07-15 DIAGNOSIS — Y92122 Bedroom in nursing home as the place of occurrence of the external cause: Secondary | ICD-10-CM | POA: Insufficient documentation

## 2017-07-15 DIAGNOSIS — I2510 Atherosclerotic heart disease of native coronary artery without angina pectoris: Secondary | ICD-10-CM | POA: Insufficient documentation

## 2017-07-15 NOTE — ED Notes (Signed)
Pt does not complain of any pain.

## 2017-07-15 NOTE — ED Notes (Signed)
Pt. Showed some symptoms of confusion about different items surrounding her (ie, the sheets, the BP cord), and shows signs of agitation (ripping pillow, ripping brief off). UA was not ordered, but a sample was collected and noted to be very cloudy (almost opaque) in appearance.

## 2017-07-15 NOTE — ED Provider Notes (Signed)
[facility]-EMERGENCY DEPT Provider Note   CSN: [id number] Arrival date & time: 07/15/17  2147     History   Chief Complaint Chief Complaint  Patient presents with  . Fall    on Eliquis    HPI Mikayla Boyd is a 83 y.o. female.  Patient with PMH of afib on eliquis, CAD, COPD, CKD, CHF, and dementia and staying in a memory care unit at [facility], presents to the ED with a chief complaint of fall.  She states that she rolled out of bed and fell to the ground this evening after going to bed.  The fall was unwitnessed.  She denies any pain or injuries.  She denies any LOC.  States that she has no symptoms now, however is demented, so history is somewhat limited.  Level 5 caveat applies 2/2 dementia.   The history is provided by the patient. No language interpreter was used.    Past Medical History:  Diagnosis Date  . A-fib (HCC)   . CHF (congestive heart failure) (HCC)   . CKD (chronic kidney disease)   . COPD (chronic obstructive pulmonary disease) (HCC)   . Coronary artery disease   . Dementia   . Depression   . Dyslipidemia   . Hypertension   . Pacemaker   . RLS (restless legs syndrome)   . Valvular heart disease   . Vertigo     There are no active problems to display for this patient.   Past Surgical History:  Procedure Laterality Date  . PACEMAKER INSERTION      OB History    No data available       Home Medications    Prior to Admission medications   Medication Sig Start Date End Date Taking? Authorizing Provider  acetaminophen (TYLENOL) 500 MG tablet Take 1,000 mg by mouth 2 (two) times daily.    [provider]  alendronate (FOSAMAX) 70 MG tablet Take 70 mg by mouth every Thursday. Take with a full glass of water on an empty stomach.     [provider]  apixaban (ELIQUIS) 5 MG TABS tablet Take 5 mg by mouth 2 (two) times daily.    [provider]  Cholecalciferol (THERA-D 2000) 2000 units TABS Take 2,000 Units by mouth  daily with breakfast.    [provider]  citalopram (CELEXA) 20 MG tablet Take 20 mg by mouth at bedtime.    [provider]  fluticasone (FLONASE) 50 MCG/ACT nasal spray Place 2 sprays into both nostrils daily.    [provider]  furosemide (LASIX) 20 MG tablet Take 20 mg by mouth daily with breakfast.     [provider]  loperamide (KLS ANTI-DIARRHEAL) 2 MG tablet Take 4 mg by mouth as needed for diarrhea or loose stools.    [provider]  loratadine (CLARITIN) 10 MG tablet Take 10 mg by mouth daily with breakfast.    [provider]  LORazepam (ATIVAN) 0.5 MG tablet Take 0.25 mg by mouth 3 (three) times daily.     [provider]  Melatonin 3 MG CAPS Take 3 mg by mouth at bedtime.    [provider]  midodrine (PROAMATINE) 2.5 MG tablet Take 2.5 mg by mouth 3 (three) times daily with meals.    [provider]  mirtazapine (REMERON) 7.5 MG tablet Take 7.5 mg by mouth at bedtime.    [provider]  nitroGLYCERIN (NITROSTAT) 0.4 MG SL tablet Place 0.4 mg under the tongue every  5 (five) minutes as needed for chest pain.    [provider]  polyethylene glycol (MIRALAX / GLYCOLAX) packet Take 17 g by mouth daily as needed for mild constipation or moderate constipation.     [provider]  pravastatin (PRAVACHOL) 20 MG tablet Take 20 mg by mouth at bedtime.    [provider]  vitamin B-12 (CYANOCOBALAMIN) 1000 MCG tablet Take 1,000 mcg by mouth daily with breakfast.     [provider]    Family History No family history on file.  Social History Social History  Substance Use Topics  . Smoking status: Never Smoker  . Smokeless tobacco: Not on file  . Alcohol use Yes     Allergies   Patient has no known allergies.   Review of Systems Review of Systems  All other systems reviewed and are negative.    Physical Exam Updated Vital Signs BP 110/60    Pulse 71   Temp (!) 97.4 F (36.3 C)   Resp (!) 22   SpO2 97%   Physical Exam  Constitutional: She is oriented to person, place, and time. She appears well-developed and well-nourished.  HENT:  Head: Normocephalic and atraumatic.  No evidence of traumatic head injury  Eyes: Pupils are equal, round, and reactive to light. Conjunctivae and EOM are normal.  Neck: Normal range of motion. Neck supple.  Cardiovascular: Normal rate and regular rhythm.  Exam reveals no gallop and no friction rub.   No murmur heard. Pulmonary/Chest: Effort normal and breath sounds normal. No respiratory distress. She has no wheezes. She has no rales. She exhibits no tenderness.  Abdominal: Soft. Bowel sounds are normal. She exhibits no distension and no mass. There is no tenderness. There is no rebound and no guarding.  Musculoskeletal: Normal range of motion. She exhibits no edema or tenderness.  Moves all extremities No bony abnormality or deformity   Neurological: She is alert and oriented to person, place, and time.  Skin: Skin is warm and dry.  No visible injuries  Psychiatric: She has a normal mood and affect. Her behavior is normal. Judgment and thought content normal.  Nursing note and vitals reviewed.    ED Treatments / Results  Labs (all labs ordered are listed, but only abnormal results are displayed) Labs Reviewed - No data to display  EKG  EKG Interpretation None       Radiology No results found.  Procedures Procedures (including critical care time)  Medications Ordered in ED Medications - No data to display   Initial Impression / Assessment and Plan / ED Course  I have reviewed the triage vital signs and the nursing notes.  Pertinent labs & imaging results that were available during my care of the patient were reviewed by me and considered in my medical decision making (see chart for details).     Patient with mechanical fall.  States she stumbled getting out of bed.  No  traumatic injuries  On physical exam. Moves all extremities.  No reported LOC.  CT head and c-spine are negative for acute findings.    Patient seen by and discussed with Dr. Blinda Leatherwood, who agrees with workup and follow-up plan.  DC to home with PCP follow-up.  Final Clinical Impressions(s) / ED Diagnoses   Final diagnoses:  Fall, initial encounter    New Prescriptions New Prescriptions   No medications on file     Roxy Horseman, Cordelia Poche 07/16/17 0009    Roxy Horseman, PA-C 07/16/17 0148  Gilda Crease, MD 07/16/17 [phone]

## 2017-07-15 NOTE — ED Notes (Signed)
Patient transported to CT 

## 2017-07-15 NOTE — ED Triage Notes (Signed)
Pt arrives via [facility] from [facility] memory care with reports of unwitnessed fall. Pt found by staff on the floor on her R side. Pt takes Eliquis. No injuries noted. Pt at baseline per EMS. VSS. Pt is in soft collar.

## 2017-07-16 NOTE — ED Notes (Signed)
[facility] called for transport.  

## 2017-09-03 ENCOUNTER — Emergency Department (HOSPITAL_COMMUNITY)
Admission: EM | Admit: 2017-09-03 | Discharge: 2017-09-03 | Disposition: A | Discharge status: Skilled Nursing Facility | Payer: Medicare Other | Attending: Emergency Medicine | Admitting: Emergency Medicine

## 2017-09-03 ENCOUNTER — Emergency Department (HOSPITAL_COMMUNITY): Payer: Medicare Other

## 2017-09-03 ENCOUNTER — Encounter (HOSPITAL_COMMUNITY): Payer: Self-pay | Admitting: Family Medicine

## 2017-09-03 DIAGNOSIS — S79911A Unspecified injury of right hip, initial encounter: Secondary | ICD-10-CM | POA: Diagnosis not present

## 2017-09-03 DIAGNOSIS — Z79899 Other long term (current) drug therapy: Secondary | ICD-10-CM | POA: Diagnosis not present

## 2017-09-03 DIAGNOSIS — Y9389 Activity, other specified: Secondary | ICD-10-CM | POA: Diagnosis not present

## 2017-09-03 DIAGNOSIS — Z950 Presence of cardiac pacemaker: Secondary | ICD-10-CM | POA: Diagnosis not present

## 2017-09-03 DIAGNOSIS — Y999 Unspecified external cause status: Secondary | ICD-10-CM | POA: Diagnosis not present

## 2017-09-03 DIAGNOSIS — I509 Heart failure, unspecified: Secondary | ICD-10-CM | POA: Diagnosis not present

## 2017-09-03 DIAGNOSIS — N189 Chronic kidney disease, unspecified: Secondary | ICD-10-CM | POA: Diagnosis not present

## 2017-09-03 DIAGNOSIS — Z7901 Long term (current) use of anticoagulants: Secondary | ICD-10-CM | POA: Insufficient documentation

## 2017-09-03 DIAGNOSIS — W1830XA Fall on same level, unspecified, initial encounter: Secondary | ICD-10-CM | POA: Diagnosis not present

## 2017-09-03 DIAGNOSIS — I259 Chronic ischemic heart disease, unspecified: Secondary | ICD-10-CM | POA: Insufficient documentation

## 2017-09-03 DIAGNOSIS — R911 Solitary pulmonary nodule: Secondary | ICD-10-CM

## 2017-09-03 DIAGNOSIS — F0390 Unspecified dementia without behavioral disturbance: Secondary | ICD-10-CM | POA: Insufficient documentation

## 2017-09-03 DIAGNOSIS — S0990XA Unspecified injury of head, initial encounter: Secondary | ICD-10-CM | POA: Diagnosis present

## 2017-09-03 DIAGNOSIS — I130 Hypertensive heart and chronic kidney disease with heart failure and stage 1 through stage 4 chronic kidney disease, or unspecified chronic kidney disease: Secondary | ICD-10-CM | POA: Insufficient documentation

## 2017-09-03 DIAGNOSIS — W19XXXA Unspecified fall, initial encounter: Secondary | ICD-10-CM

## 2017-09-03 DIAGNOSIS — J449 Chronic obstructive pulmonary disease, unspecified: Secondary | ICD-10-CM | POA: Insufficient documentation

## 2017-09-03 DIAGNOSIS — Y92128 Other place in nursing home as the place of occurrence of the external cause: Secondary | ICD-10-CM | POA: Insufficient documentation

## 2017-09-03 NOTE — ED Provider Notes (Signed)
[facility]-EMERGENCY DEPT Provider Note   CSN: [id number] Arrival date & time: 09/03/17  0137     History   Chief Complaint Chief Complaint  Patient presents with  . Fall    HPI Mikayla Boyd is a 83 y.o. female.  The history is provided by the EMS personnel.  Fall  This is a new problem. Episode onset: unknown as was unwitnessed. The problem occurs rarely. The problem has been resolved. Nothing aggravates the symptoms. Nothing relieves the symptoms. She has tried nothing for the symptoms. The treatment provided no relief.    Past Medical History:  Diagnosis Date  . A-fib (HCC)   . CHF (congestive heart failure) (HCC)   . CKD (chronic kidney disease)   . COPD (chronic obstructive pulmonary disease) (HCC)   . Coronary artery disease   . Dementia   . Depression   . Dyslipidemia   . Hypertension   . Pacemaker   . RLS (restless legs syndrome)   . Valvular heart disease   . Vertigo     There are no active problems to display for this patient.   Past Surgical History:  Procedure Laterality Date  . PACEMAKER INSERTION      OB History    No data available       Home Medications    Prior to Admission medications   Medication Sig Start Date End Date Taking? Authorizing Provider  acetaminophen (TYLENOL) 500 MG tablet Take 1,000 mg by mouth 2 (two) times daily.    [provider]  apixaban (ELIQUIS) 5 MG TABS tablet Take 5 mg by mouth 2 (two) times daily.    [provider]  Cholecalciferol (THERA-D 2000) 2000 units TABS Take 2,000 Units by mouth daily with breakfast.    [provider]  citalopram (CELEXA) 20 MG tablet Take 20 mg by mouth at bedtime.    [provider]  fluticasone (FLONASE) 50 MCG/ACT nasal spray Place 2 sprays into both nostrils daily.    [provider]  furosemide (LASIX) 20 MG tablet Take 20 mg by mouth every other day.     [provider]  loperamide (KLS  ANTI-DIARRHEAL) 2 MG tablet Take 2-4 mg by mouth daily as needed for diarrhea or loose stools.     [provider]  loratadine (CLARITIN) 10 MG tablet Take 10 mg by mouth daily with breakfast.    [provider]  LORazepam (ATIVAN) 0.5 MG tablet Take 0.5 mg by mouth 2 (two) times daily.     [provider]  Melatonin 3 MG CAPS Take 5 mg by mouth at bedtime.     [provider]  metoCLOPramide (REGLAN) 10 MG tablet Take 10 mg by mouth 3 (three) times daily.    [provider]  midodrine (PROAMATINE) 2.5 MG tablet Take 2.5 mg by mouth 3 (three) times daily with meals.    [provider]  mirtazapine (REMERON) 7.5 MG tablet Take 15 mg by mouth at bedtime.     [provider]  nitroGLYCERIN (NITROSTAT) 0.4 MG SL tablet Place 0.4 mg under the tongue every 5 (five) minutes as needed for chest pain.    [provider]  polyethylene glycol (MIRALAX / GLYCOLAX) packet Take 17 g by mouth daily as needed for mild constipation or moderate constipation.     [provider]  pravastatin (PRAVACHOL) 20 MG tablet Take 20 mg by mouth at bedtime.    [provider]  senna-docusate (SENOKOT-S)  8.6-50 MG tablet Take 1 tablet by mouth daily.    [provider]  traZODone (DESYREL) 100 MG tablet Take 100 mg by mouth at bedtime.    [provider]  vitamin B-12 (CYANOCOBALAMIN) 1000 MCG tablet Take 1,000 mcg by mouth daily with breakfast.     [provider]  ZINC OXIDE, TOPICAL, (SECURA PROTECTIVE) 10 % CREA Apply 1 application topically daily as needed (skin irritation).    [provider]    Family History History reviewed. No pertinent family history.  Social History Social History   Tobacco Use  . Smoking status: Never Smoker  . Smokeless tobacco: Never Used  Substance Use Topics  . Alcohol use: No    Frequency: Never  . Drug use: No     Allergies   Patient has no known  allergies.   Review of Systems Review of Systems  Unable to perform ROS: Dementia     Physical Exam Updated Vital Signs BP (!) 103/55 (BP Location: Right Arm)   Pulse 65   Temp 98.2 F (36.8 C) (Oral)   Resp (!) 26   SpO2 96%   Physical Exam  Constitutional: She appears well-developed and well-nourished. No distress.  HENT:  Head: Normocephalic and atraumatic. Head is without raccoon's eyes and without Battle's sign.  Mouth/Throat: No oropharyngeal exudate.  Eyes: Conjunctivae are normal. Pupils are equal, round, and reactive to light.  Neck: Normal range of motion. Neck supple.  Cardiovascular: Normal rate, regular rhythm, normal heart sounds and intact distal pulses.  Pulmonary/Chest: Effort normal and breath sounds normal. No stridor. She has no wheezes. She has no rales.  Abdominal: Soft. Bowel sounds are normal. She exhibits no mass. There is no tenderness. There is no rebound and no guarding.  Musculoskeletal: Normal range of motion.  Neurological: She is alert. She displays normal reflexes.  Skin: Skin is warm and dry. Capillary refill takes less than 2 seconds.     ED Treatments / Results   Vitals:   09/03/17 0141 09/03/17 0148  BP:  (!) 103/55  Pulse:  65  Resp:  (!) 26  Temp:  98.2 F (36.8 C)  SpO2: 99% 96%    Radiology Dg Hips Bilat W Or Wo Pelvis 2 Views  Result Date: 09/03/2017 CLINICAL DATA:  Status post unwitnessed fall, with left hip pain. Initial encounter. EXAM: DG HIP (WITH OR WITHOUT PELVIS) 2V BILAT COMPARISON:  Pelvis radiographs performed 05/07/2016 FINDINGS: There is no evidence of fracture or dislocation. Both femoral heads are seated normally within their respective acetabula. The proximal femurs appear intact bilaterally. No significant degenerative change is appreciated. The sacroiliac joints are unremarkable in appearance. The visualized bowel gas pattern is grossly unremarkable in appearance. IMPRESSION: No evidence of fracture or  dislocation. Electronically Signed   By: Roanna RaiderJeffery  Chang M.D.   On: 09/03/2017 02:27    Procedures Procedures (including critical care time)     Final Clinical Impressions(s) / ED Diagnoses     All questions answered to the patient's satisfaction.    Strict return precautions for fever, global weakness, blood in the urine, abdominal distention, vomiting, no drainage from the foley catheter, swelling or the lips or tongue, chest pain, dyspnea on exertion, new weakness or numbness changes in vision or speech, fevers, weakness persistent pain, Inability to tolerate liquids or food, changes in voice cough, altered mental status or any concerns. No signs of systemic illness or infection. The patient is nontoxic-appearing on exam and vital signs are within  normal limits.    I have reviewed the triage vital signs and the nursing notes. Pertinent labs &imaging results that were available during my care of the patient were reviewed by me and considered in my medical decision making (see chart for details).  After history, exam, and medical workup I feel the patient has been appropriately medically screened and is safe for discharge home. Pertinent diagnoses were discussed with the patient. Patient was given return precautions   Quantay Zaremba, MD 09/03/17 [phone]

## 2017-09-03 NOTE — ED Triage Notes (Signed)
[facility] Patient has an unwitnessed fall from a standing position to there buttocks. Patient was attempting to put a pair of briefs on over her other briefs. Patient denied hitting her head but does have dementia. EMS reports she is complaining of right flank and ileac crest pain on tenderness.

## 2017-09-03 NOTE — ED Notes (Signed)
Patient transported to radiology

## 2017-09-03 NOTE — ED Notes (Signed)
Bed: WA11 Expected date:  Expected time:  Means of arrival:  Comments: Ems fall 

## 2017-10-04 ENCOUNTER — Inpatient Hospital Stay (HOSPITAL_COMMUNITY)
Admission: EM | Admit: 2017-10-04 | Discharge: 2017-10-08 | DRG: 690 | Disposition: A | Discharge status: Skilled Nursing Facility | Payer: Medicare Other | Attending: Internal Medicine | Admitting: Internal Medicine

## 2017-10-04 ENCOUNTER — Encounter (HOSPITAL_COMMUNITY): Payer: Self-pay | Admitting: *Deleted

## 2017-10-04 ENCOUNTER — Other Ambulatory Visit: Payer: Self-pay

## 2017-10-04 DIAGNOSIS — Z7901 Long term (current) use of anticoagulants: Secondary | ICD-10-CM

## 2017-10-04 DIAGNOSIS — I5032 Chronic diastolic (congestive) heart failure: Secondary | ICD-10-CM | POA: Diagnosis not present

## 2017-10-04 DIAGNOSIS — D72819 Decreased white blood cell count, unspecified: Secondary | ICD-10-CM | POA: Diagnosis present

## 2017-10-04 DIAGNOSIS — N390 Urinary tract infection, site not specified: Secondary | ICD-10-CM | POA: Diagnosis not present

## 2017-10-04 DIAGNOSIS — I9589 Other hypotension: Secondary | ICD-10-CM | POA: Diagnosis present

## 2017-10-04 DIAGNOSIS — R32 Unspecified urinary incontinence: Secondary | ICD-10-CM | POA: Diagnosis present

## 2017-10-04 DIAGNOSIS — Z1612 Extended spectrum beta lactamase (ESBL) resistance: Secondary | ICD-10-CM | POA: Diagnosis present

## 2017-10-04 DIAGNOSIS — R062 Wheezing: Secondary | ICD-10-CM

## 2017-10-04 DIAGNOSIS — I130 Hypertensive heart and chronic kidney disease with heart failure and stage 1 through stage 4 chronic kidney disease, or unspecified chronic kidney disease: Secondary | ICD-10-CM | POA: Diagnosis present

## 2017-10-04 DIAGNOSIS — B372 Candidiasis of skin and nail: Secondary | ICD-10-CM

## 2017-10-04 DIAGNOSIS — N183 Chronic kidney disease, stage 3 (moderate): Secondary | ICD-10-CM | POA: Diagnosis present

## 2017-10-04 DIAGNOSIS — I482 Chronic atrial fibrillation, unspecified: Secondary | ICD-10-CM

## 2017-10-04 DIAGNOSIS — B9620 Unspecified Escherichia coli [E. coli] as the cause of diseases classified elsewhere: Secondary | ICD-10-CM | POA: Diagnosis present

## 2017-10-04 DIAGNOSIS — A498 Other bacterial infections of unspecified site: Secondary | ICD-10-CM

## 2017-10-04 DIAGNOSIS — Z79899 Other long term (current) drug therapy: Secondary | ICD-10-CM

## 2017-10-04 DIAGNOSIS — G2581 Restless legs syndrome: Secondary | ICD-10-CM | POA: Diagnosis present

## 2017-10-04 DIAGNOSIS — K5900 Constipation, unspecified: Secondary | ICD-10-CM | POA: Diagnosis present

## 2017-10-04 DIAGNOSIS — F0390 Unspecified dementia without behavioral disturbance: Secondary | ICD-10-CM

## 2017-10-04 DIAGNOSIS — B356 Tinea cruris: Secondary | ICD-10-CM

## 2017-10-04 DIAGNOSIS — I2510 Atherosclerotic heart disease of native coronary artery without angina pectoris: Secondary | ICD-10-CM | POA: Diagnosis present

## 2017-10-04 DIAGNOSIS — L89301 Pressure ulcer of unspecified buttock, stage 1: Secondary | ICD-10-CM | POA: Diagnosis not present

## 2017-10-04 DIAGNOSIS — F329 Major depressive disorder, single episode, unspecified: Secondary | ICD-10-CM | POA: Diagnosis present

## 2017-10-04 DIAGNOSIS — Z66 Do not resuscitate: Secondary | ICD-10-CM | POA: Diagnosis present

## 2017-10-04 DIAGNOSIS — D649 Anemia, unspecified: Secondary | ICD-10-CM | POA: Diagnosis present

## 2017-10-04 DIAGNOSIS — R110 Nausea: Secondary | ICD-10-CM | POA: Diagnosis not present

## 2017-10-04 DIAGNOSIS — Z950 Presence of cardiac pacemaker: Secondary | ICD-10-CM

## 2017-10-04 DIAGNOSIS — E785 Hyperlipidemia, unspecified: Secondary | ICD-10-CM | POA: Diagnosis present

## 2017-10-04 DIAGNOSIS — Z1619 Resistance to other specified beta lactam antibiotics: Secondary | ICD-10-CM | POA: Diagnosis present

## 2017-10-04 DIAGNOSIS — J449 Chronic obstructive pulmonary disease, unspecified: Secondary | ICD-10-CM | POA: Diagnosis present

## 2017-10-04 LAB — BASIC METABOLIC PANEL
Anion gap: 4 — ABNORMAL LOW (ref 5–15)
BUN: 24 mg/dL — AB (ref 6–20)
CHLORIDE: 106 mmol/L (ref 101–111)
CO2: 28 mmol/L (ref 22–32)
CREATININE: 1 mg/dL (ref 0.44–1.00)
Calcium: 8.4 mg/dL — ABNORMAL LOW (ref 8.9–10.3)
GFR calc Af Amer: 55 mL/min — ABNORMAL LOW (ref >60)
GFR calc non Af Amer: 48 mL/min — ABNORMAL LOW (ref >60)
Glucose, Bld: 118 mg/dL — ABNORMAL HIGH (ref 65–99)
Potassium: 4.3 mmol/L (ref 3.5–5.1)
SODIUM: 138 mmol/L (ref 135–145)

## 2017-10-04 LAB — CBC WITH DIFFERENTIAL/PLATELET
Basophils Absolute: 0 10*3/uL (ref 0.0–0.1)
Basophils Relative: 0 %
EOS ABS: 0 10*3/uL (ref 0.0–0.7)
EOS PCT: 1 %
HCT: 30.1 % — ABNORMAL LOW (ref 36.0–46.0)
Hemoglobin: 9.6 g/dL — ABNORMAL LOW (ref 12.0–15.0)
LYMPHS ABS: 0.9 10*3/uL (ref 0.7–4.0)
Lymphocytes Relative: 17 %
MCH: 32.5 pg (ref 26.0–34.0)
MCHC: 31.9 g/dL (ref 30.0–36.0)
MCV: 102 fL — ABNORMAL HIGH (ref 78.0–100.0)
MONOS PCT: 10 %
Monocytes Absolute: 0.6 10*3/uL (ref 0.1–1.0)
Neutro Abs: 3.9 10*3/uL (ref 1.7–7.7)
Neutrophils Relative %: 72 %
PLATELETS: 213 10*3/uL (ref 150–400)
RBC: 2.95 MIL/uL — ABNORMAL LOW (ref 3.87–5.11)
RDW: 14.6 % (ref 11.5–15.5)
WBC: 5.4 10*3/uL (ref 4.0–10.5)

## 2017-10-04 LAB — URINALYSIS, ROUTINE W REFLEX MICROSCOPIC
Bilirubin Urine: NEGATIVE
GLUCOSE, UA: NEGATIVE mg/dL
Ketones, ur: NEGATIVE mg/dL
Nitrite: NEGATIVE
PH: 5 (ref 5.0–8.0)
PROTEIN: 100 mg/dL — AB
SQUAMOUS EPITHELIAL / LPF: NONE SEEN
Specific Gravity, Urine: 1.016 (ref 1.005–1.030)

## 2017-10-04 LAB — I-STAT CG4 LACTIC ACID, ED
LACTIC ACID, VENOUS: 2.14 mmol/L — AB (ref 0.5–1.9)
Lactic Acid, Venous: 1.34 mmol/L (ref 0.5–1.9)

## 2017-10-04 MED ORDER — DEXTROSE 5 % IV SOLN
1.0000 g | Freq: Once | INTRAVENOUS | Status: AC
  Administered 2017-10-04: 1 g via INTRAVENOUS
  Filled 2017-10-04: qty 10

## 2017-10-04 MED ORDER — DIVALPROEX SODIUM 125 MG PO CSDR
250.0000 mg | DELAYED_RELEASE_CAPSULE | Freq: Two times a day (BID) | ORAL | Status: DC
  Administered 2017-10-04 – 2017-10-08 (×8): 250 mg via ORAL
  Filled 2017-10-04 (×9): qty 2

## 2017-10-04 MED ORDER — NYSTATIN 100000 UNIT/GM EX POWD
1.0000 | Freq: Two times a day (BID) | CUTANEOUS | Status: DC | PRN
  Filled 2017-10-04: qty 15

## 2017-10-04 MED ORDER — ACETAMINOPHEN 500 MG PO TABS
1000.0000 mg | ORAL_TABLET | Freq: Two times a day (BID) | ORAL | Status: DC
  Administered 2017-10-04 – 2017-10-08 (×8): 1000 mg via ORAL
  Filled 2017-10-04 (×8): qty 2

## 2017-10-04 MED ORDER — APIXABAN 5 MG PO TABS
5.0000 mg | ORAL_TABLET | Freq: Two times a day (BID) | ORAL | Status: DC
  Administered 2017-10-04 – 2017-10-08 (×8): 5 mg via ORAL
  Filled 2017-10-04 (×8): qty 1

## 2017-10-04 MED ORDER — ZINC OXIDE 20 % EX OINT
1.0000 "application " | TOPICAL_OINTMENT | Freq: Every day | CUTANEOUS | Status: DC | PRN
  Filled 2017-10-04: qty 28.35

## 2017-10-04 MED ORDER — LORATADINE 10 MG PO TABS
10.0000 mg | ORAL_TABLET | Freq: Every day | ORAL | Status: DC
  Administered 2017-10-05 – 2017-10-08 (×4): 10 mg via ORAL
  Filled 2017-10-04 (×4): qty 1

## 2017-10-04 MED ORDER — CITALOPRAM HYDROBROMIDE 20 MG PO TABS
20.0000 mg | ORAL_TABLET | Freq: Every day | ORAL | Status: DC
  Administered 2017-10-04 – 2017-10-07 (×4): 20 mg via ORAL
  Filled 2017-10-04 (×4): qty 1

## 2017-10-04 MED ORDER — VITAMIN B-12 1000 MCG PO TABS
1000.0000 ug | ORAL_TABLET | Freq: Every day | ORAL | Status: DC
  Administered 2017-10-05 – 2017-10-08 (×4): 1000 ug via ORAL
  Filled 2017-10-04 (×4): qty 1

## 2017-10-04 MED ORDER — SENNOSIDES-DOCUSATE SODIUM 8.6-50 MG PO TABS
1.0000 | ORAL_TABLET | Freq: Every day | ORAL | Status: DC
  Administered 2017-10-05 – 2017-10-08 (×4): 1 via ORAL
  Filled 2017-10-04 (×4): qty 1

## 2017-10-04 MED ORDER — TRAZODONE HCL 100 MG PO TABS
100.0000 mg | ORAL_TABLET | Freq: Every day | ORAL | Status: DC
  Administered 2017-10-04 – 2017-10-07 (×4): 100 mg via ORAL
  Filled 2017-10-04 (×3): qty 1

## 2017-10-04 MED ORDER — POLYETHYLENE GLYCOL 3350 17 G PO PACK: 17.0000 g | PACK | Freq: Every day | ORAL | Status: DC | PRN

## 2017-10-04 MED ORDER — GERHARDT'S BUTT CREAM
TOPICAL_CREAM | CUTANEOUS | Status: DC
  Administered 2017-10-04 – 2017-10-05 (×4): via TOPICAL
  Administered 2017-10-05: 1 via TOPICAL
  Administered 2017-10-05 (×2): via TOPICAL
  Administered 2017-10-05 – 2017-10-06 (×2): 1 via TOPICAL
  Administered 2017-10-06: via TOPICAL
  Administered 2017-10-06: 1 via TOPICAL
  Administered 2017-10-06 (×2): via TOPICAL
  Administered 2017-10-06: 1 via TOPICAL
  Administered 2017-10-06 – 2017-10-07 (×3): via TOPICAL
  Administered 2017-10-07: 1 via TOPICAL
  Administered 2017-10-07 – 2017-10-08 (×6): via TOPICAL
  Filled 2017-10-04 (×8): qty 1

## 2017-10-04 MED ORDER — MIDODRINE HCL 5 MG PO TABS
2.5000 mg | ORAL_TABLET | Freq: Three times a day (TID) | ORAL | Status: DC
  Administered 2017-10-05 – 2017-10-08 (×11): 2.5 mg via ORAL
  Filled 2017-10-04 (×9): qty 1

## 2017-10-04 MED ORDER — PRAVASTATIN SODIUM 20 MG PO TABS
20.0000 mg | ORAL_TABLET | Freq: Every day | ORAL | Status: DC
  Administered 2017-10-04 – 2017-10-07 (×4): 20 mg via ORAL
  Filled 2017-10-04 (×4): qty 1

## 2017-10-04 MED ORDER — VITAMIN D 1000 UNITS PO TABS
2000.0000 [IU] | ORAL_TABLET | Freq: Every day | ORAL | Status: DC
  Administered 2017-10-05 – 2017-10-08 (×4): 2000 [IU] via ORAL
  Filled 2017-10-04 (×4): qty 2

## 2017-10-04 MED ORDER — SODIUM CHLORIDE 0.9 % IV BOLUS (SEPSIS)
500.0000 mL | Freq: Once | INTRAVENOUS | Status: AC
  Administered 2017-10-04: 500 mL via INTRAVENOUS

## 2017-10-04 MED ORDER — FLUTICASONE PROPIONATE 50 MCG/ACT NA SUSP
2.0000 | Freq: Every day | NASAL | Status: DC
  Administered 2017-10-08: 10:00:00 2 via NASAL
  Filled 2017-10-04: qty 16

## 2017-10-04 MED ORDER — MIRTAZAPINE 15 MG PO TABS
7.5000 mg | ORAL_TABLET | Freq: Every day | ORAL | Status: DC
  Administered 2017-10-04 – 2017-10-07 (×4): 7.5 mg via ORAL
  Filled 2017-10-04 (×4): qty 1

## 2017-10-04 MED ORDER — DEXTROSE 5 % IV SOLN
1.0000 g | INTRAVENOUS | Status: DC
  Administered 2017-10-05: 1 g via INTRAVENOUS
  Filled 2017-10-04 (×2): qty 10

## 2017-10-04 MED ORDER — SODIUM CHLORIDE 0.9 % IV SOLN
INTRAVENOUS | Status: DC
  Administered 2017-10-04 – 2017-10-06 (×2): via INTRAVENOUS

## 2017-10-04 MED ORDER — FLUCONAZOLE 100MG IVPB
100.0000 mg | INTRAVENOUS | Status: DC
  Administered 2017-10-04: 22:00:00 100 mg via INTRAVENOUS
  Filled 2017-10-04: qty 50

## 2017-10-04 NOTE — ED Notes (Signed)
Be assigned @ 2011 rm 1610

## 2017-10-04 NOTE — ED Notes (Signed)
Spoke with would nurse, she will be here in about 20 mins.

## 2017-10-04 NOTE — H&P (Signed)
History and Physical    Mikayla Boyd ZOX:[id number] DOB: 09-20-1924 DOA: 10/04/2017  I have briefly reviewed the patient's prior medical records in [facility]  PCP: Florentina Jenny, MD  Patient coming from: Assisted living facility  Chief Complaint: Buttock pain  HPI: Mikayla Boyd is a 83 y.o. female with medical history significant of chronic A. fib currently with a pacemaker, COPD, dementia, hypertension, presents to the emergency room from her assisted living facility with significant buttock pain.  Patient has underlying dementia and cannot contribute anything to the story other than she is in pain, cannot tell me for how long.  She tells me she is incontinent and stays wet a long time.  She cannot tell me if she has abdominal pain, nausea vomiting, chest pain, to all my questions she states "I do not know".  History as per ED  ED Course: In the emergency room she is afebrile, blood pressure in the 90s-100s, heart rate in the 60s.  She is satting well on room air.  Wound care was consulted and have seen the patient in the ER.  Blood work revealed a lactic acid of 2.1, which responded to fluids to 1.3.  She has mild anemia.  UA with evidence of UTI, and on exam strong evidence of fungal infection.  She was placed on antibiotics and we were asked to admit  Review of Systems: Unable to obtain review of systems due to underlying dementia  Past Medical History:  Diagnosis Date  . A-fib (HCC)   . CHF (congestive heart failure) (HCC)   . CKD (chronic kidney disease)   . COPD (chronic obstructive pulmonary disease) (HCC)   . Coronary artery disease   . Dementia   . Depression   . Dyslipidemia   . Hypertension   . Pacemaker   . RLS (restless legs syndrome)   . Valvular heart disease   . Vertigo     Past Surgical History:  Procedure Laterality Date  . PACEMAKER INSERTION       reports that  has never smoked. she has never used smokeless tobacco. She reports that she  does not drink alcohol or use drugs.  No Known Allergies  Unable to obtain family history due to underlying dementia  Prior to Admission medications   Medication Sig Start Date End Date Taking? Authorizing Provider  acetaminophen (TYLENOL) 500 MG tablet Take 1,000 mg by mouth 2 (two) times daily.   Yes [provider]  apixaban (ELIQUIS) 5 MG TABS tablet Take 5 mg by mouth 2 (two) times daily.   Yes [provider]  cetirizine (ZYRTEC) 10 MG tablet Take 10 mg by mouth daily.   Yes [provider]  Cholecalciferol (THERA-D 2000) 2000 units TABS Take 2,000 Units by mouth daily with breakfast.   Yes [provider]  citalopram (CELEXA) 20 MG tablet Take 20 mg by mouth at bedtime.   Yes [provider]  divalproex (DEPAKOTE SPRINKLE) 125 MG capsule Take 250 mg by mouth 2 (two) times daily.   Yes [provider]  ENSURE PLUS (ENSURE PLUS) LIQD Take 237 mLs by mouth 3 (three) times daily between meals.   Yes [provider]  fluticasone (FLONASE) 50 MCG/ACT nasal spray Place 2 sprays into both nostrils daily.   Yes [provider]  furosemide (LASIX) 20 MG tablet Take 20 mg by mouth every other day.    Yes [provider]  loratadine (CLARITIN) 10 MG tablet Take 10 mg by mouth  daily with breakfast.   Yes [provider]  Melatonin 3 MG CAPS Take 5 mg by mouth at bedtime.    Yes [provider]  midodrine (PROAMATINE) 2.5 MG tablet Take 2.5 mg by mouth 3 (three) times daily with meals.   Yes [provider]  mirtazapine (REMERON) 7.5 MG tablet Take 7.5 mg by mouth at bedtime.    Yes [provider]  nystatin (NYSTATIN) powder Apply 1 Bottle topically 2 (two) times daily as needed (rash).   Yes [provider]  nystatin cream (MYCOSTATIN) Apply 1 application topically daily as needed for dry skin (rash).   Yes [provider]  pravastatin (PRAVACHOL) 20 MG tablet Take  20 mg by mouth at bedtime.   Yes [provider]  senna-docusate (SENOKOT-S) 8.6-50 MG tablet Take 1 tablet by mouth daily.   Yes [provider]  traZODone (DESYREL) 100 MG tablet Take 100 mg by mouth at bedtime.   Yes [provider]  vitamin B-12 (CYANOCOBALAMIN) 1000 MCG tablet Take 1,000 mcg by mouth daily with breakfast.    Yes [provider]  loperamide (KLS ANTI-DIARRHEAL) 2 MG tablet Take 2-4 mg by mouth daily as needed for diarrhea or loose stools.     [provider]  LORazepam (ATIVAN) 0.5 MG tablet Take 0.5 mg by mouth 2 (two) times daily.     [provider]  metoCLOPramide (REGLAN) 10 MG tablet Take 10 mg by mouth 3 (three) times daily.    [provider]  nitroGLYCERIN (NITROSTAT) 0.4 MG SL tablet Place 0.4 mg under the tongue every 5 (five) minutes as needed for chest pain.    [provider]  polyethylene glycol (MIRALAX / GLYCOLAX) packet Take 17 g by mouth daily as needed for mild constipation or moderate constipation.     [provider]  ZINC OXIDE, TOPICAL, (SECURA PROTECTIVE) 10 % CREA Apply 1 application topically daily as needed (skin irritation).    [provider]    Physical Exam: Vitals:   10/04/17 0922 10/04/17 0932 10/04/17 1100 10/04/17 1300  BP: (!) 102/47  (!) 94/46 (!) 105/50  Pulse: 66  65 64  Resp: 20  18 16   Temp: 97.6 F (36.4 C)     TempSrc: Oral     SpO2: 100%  99% 92%  Weight:  68 kg (150 lb)    Height:  5\' 5"  (1.651 m)      Constitutional: NAD Eyes: lids and conjunctivae normal ENMT: Mucous membranes are moist.  Neck: normal, supple Respiratory: clear to auscultation bilaterally, no wheezing, no crackles. Normal respiratory effort. No accessory muscle use.  Cardiovascular: Regular rate and rhythm, no murmurs / rubs / gallops. No extremity edema. 2+ pedal pulses.  Abdomen: no tenderness, no masses palpated. Bowel sounds positive.  Musculoskeletal: no  clubbing / cyanosis. Normal muscle tone.  Skin: Significant rash on the buttocks perineum as well as medial thighs, erythematous, very tender to palpation and to minimal movement Neurologic: CN 2-12 grossly intact. Strength 5/5 in all 4.  Psychiatric: Normal judgment and insight. Alert and oriented to person only.   Labs on Admission: I have personally reviewed following labs and imaging studies  CBC: Recent Labs  Lab 10/04/17 1101  WBC 5.4  NEUTROABS 3.9  HGB 9.6*  HCT 30.1*  MCV 102.0*  PLT 213   Basic Metabolic Panel: Recent Labs  Lab 10/04/17 1101  NA 138  K 4.3  CL 106  CO2 28  GLUCOSE  118*  BUN 24*  CREATININE 1.00  CALCIUM 8.4*   GFR: Estimated Creatinine Clearance: 33 mL/min (by C-G formula based on SCr of 1 mg/dL). Liver Function Tests: No results for input(s): AST, ALT, ALKPHOS, BILITOT, PROT, ALBUMIN in the last 168 hours. No results for input(s): LIPASE, AMYLASE in the last 168 hours. No results for input(s): AMMONIA in the last 168 hours. Coagulation Profile: No results for input(s): INR, PROTIME in the last 168 hours. Cardiac Enzymes: No results for input(s): CKTOTAL, CKMB, CKMBINDEX, TROPONINI in the last 168 hours. BNP (last 3 results) No results for input(s): PROBNP in the last 8760 hours. HbA1C: No results for input(s): HGBA1C in the last 72 hours. CBG: No results for input(s): GLUCAP in the last 168 hours. Lipid Profile: No results for input(s): CHOL, HDL, LDLCALC, TRIG, CHOLHDL, LDLDIRECT in the last 72 hours. Thyroid Function Tests: No results for input(s): TSH, T4TOTAL, FREET4, T3FREE, THYROIDAB in the last 72 hours. Anemia Panel: No results for input(s): VITAMINB12, FOLATE, FERRITIN, TIBC, IRON, RETICCTPCT in the last 72 hours. Urine analysis:    Component Value Date/Time   COLORURINE AMBER (A) 10/04/2017 1134   APPEARANCEUR CLOUDY (A) 10/04/2017 1134   LABSPEC 1.016 10/04/2017 1134   PHURINE 5.0 10/04/2017 1134   GLUCOSEU NEGATIVE  10/04/2017 1134   HGBUR SMALL (A) 10/04/2017 1134   BILIRUBINUR NEGATIVE 10/04/2017 1134   KETONESUR NEGATIVE 10/04/2017 1134   PROTEINUR 100 (A) 10/04/2017 1134   NITRITE NEGATIVE 10/04/2017 1134   LEUKOCYTESUR LARGE (A) 10/04/2017 1134     Radiological Exams on Admission: No results found.  EKG: Independently reviewed.  Paced rhythm  Assessment/Plan Active Problems:   UTI (urinary tract infection)   Chronic diastolic CHF (congestive heart failure) (HCC)   Pacemaker   Atrial fibrillation, chronic (HCC)   Dementia   Acute lower UTI   Fungal infection of the groin   UTI /groin fungal infection -UA consistent with a UTI, placed on ceftriaxone, send urine cultures -Add Diflucan given significant fungal infection in the groin area -Unlikely that patient can continue to live in assisted living given her current condition, will order PT she may need to be evaluated for SNF or higher level of care  Chronic diastolic CHF -Most recent 2D echo in care everywhere showed normal LV function in 2016 -Currently appears euvolemic, gentle hydration -Appears to be on midodrine,?  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 fib status post pacemaker -Heart rate this regular, paced, she is already on Eliquis, continue  Dementia -Continue home medications   DVT prophylaxis: Eliquis  Code Status: DNR (durable yellow DNR form present with patient)  Family Communication: no family at bedside Disposition Plan: admit to MedSurg Consults called: none     Admission status: Observation   At the point of initial evaluation, it is my clinical opinion that admission for OBSERVATION is reasonable and necessary because the patient's presenting complaints in the context of their chronic conditions represent sufficient risk of deterioration or significant morbidity to constitute reasonable grounds for close observation in the hospital  setting, but that the patient may be medically stable for discharge from the hospital within 24 to 48 hours.    Pamella Pertostin Mikayla Ehrsam, MD [facility] Pager [phone]  If 7PM-7AM, please contact night-coverage [email or web address] Password [email or web address]  10/04/2017, 2:28 PM

## 2017-10-04 NOTE — Consult Note (Signed)
WOC Nurse wound consult note Reason for Consult:Severe incontinence associated dermatitis with likely fungal overgrowth (eg. Yeast/candida  Infection). Patient is intermittently lucid, tells me that she lives at home (she lives in an Assisted Living Facility), is "constantly wet" and was "either sitting my chair or lying on my back in bed".  When I discuss ways to keep her buttocks dry, she replies, "I will have to sleep on my stomach". Using PurWick external powered female urinary collection device while in ED. States that she does not wear adult diapers although pattern of IAD is consistent with that of an adult brief that has been worn too long. Wound type:Moisture, infection Pressure Injury POA: N/A Measurement:20cm x 20cm area of involvement on buttocks and to perineum, also medial thighs, posterior thighs Wound WUJ:[id number]:scattered partial thickness openings, pink, moist wound bed with no exudate Drainage (amount, consistency, odor) none to scant serous.  Tissues are dry. Periwound: No satellite lesions, but vaginal discharge consistent with yeast overgrowth. Dressing procedure/placement/frequency: Depending on disposition, patient is to receive skin care using a prescriptive compounded product, Gerhart's Butt Cream (1:1:1 zinc oxide, hydrocortisone, lotrimin) applied after cleansing with tepid tap water every 4 hours.  Turning and repositioning from side to side with superior leg over inferior leg to "open" buttocks. She will receive a pressure redistribution chair pad for use in her chair. If admitted, will require a mattress replacement. Finally, patient may require a higher level of care (i.e., SNF). If admitted, Clinical Social Work to be consulted.  WOC nursing team will not follow, but will remain available to this patient, the nursing and medical teams.  Please re-consult if needed. Thanks, Ladona MowLaurie Sorah Falkenstein, MSN, RN, GNP, Hans EdenCWOCN, CWON-AP, FAAN  Pager# [phone]

## 2017-10-04 NOTE — ED Triage Notes (Signed)
Per EMS, pt from [facility] for bed sores of unknown time. Facility also states pt is having yellowish-green discharge. EMS states pt appeared to be in pain when touching bed sores. Pt has hx of dementia. Pt is DNR.  BP 106/70 HR 72 RR 17 CBG 171

## 2017-10-04 NOTE — ED Notes (Signed)
Bed: [id number] Expected date:  Expected time:  Means of arrival:  Comments: 83 yo f bed sores, vaginal discharge

## 2017-10-04 NOTE — ED Provider Notes (Signed)
[facility]-EMERGENCY DEPT Provider Note   CSN: [id number] Arrival date & time: 10/04/17  1308     History   Chief Complaint Chief Complaint  Patient presents with  . Pressure Ulcer/Deep tissue injury    HPI Mikayla Boyd is a 83 y.o. female.  Patient is here for evaluation of soreness on her buttocks, skin breakdown, and drainage from the buttocks.  According to a family member, daughter-in-law, the patient has been less active, than usual and refusing to get out of bed to clean herself.  Patient has baseline dementia and lives in a memory care unit.  She is typically ambulatory but has not been walking much recently.  The patient is unable to give history.  Level 5 caveat-dementia  HPI  Past Medical History:  Diagnosis Date  . A-fib (HCC)   . CHF (congestive heart failure) (HCC)   . CKD (chronic kidney disease)   . COPD (chronic obstructive pulmonary disease) (HCC)   . Coronary artery disease   . Dementia   . Depression   . Dyslipidemia   . Hypertension   . Pacemaker   . RLS (restless legs syndrome)   . Valvular heart disease   . Vertigo     Patient Active Problem List   Diagnosis Date Noted  . UTI (urinary tract infection) 10/04/2017  . Chronic diastolic CHF (congestive heart failure) (HCC) 10/04/2017  . Pacemaker 10/04/2017  . Atrial fibrillation, chronic (HCC) 10/04/2017  . Dementia 10/04/2017  . Acute lower UTI 10/04/2017  . Fungal infection of the groin 10/04/2017    Past Surgical History:  Procedure Laterality Date  . PACEMAKER INSERTION      OB History    No data available       Home Medications    Prior to Admission medications   Medication Sig Start Date End Date Taking? Authorizing Provider  acetaminophen (TYLENOL) 500 MG tablet Take 1,000 mg by mouth 2 (two) times daily.   Yes [provider]  apixaban (ELIQUIS) 5 MG TABS tablet Take 5 mg by mouth 2 (two) times daily.   Yes [provider]    cetirizine (ZYRTEC) 10 MG tablet Take 10 mg by mouth daily.   Yes [provider]  Cholecalciferol (THERA-D 2000) 2000 units TABS Take 2,000 Units by mouth daily with breakfast.   Yes [provider]  citalopram (CELEXA) 20 MG tablet Take 20 mg by mouth at bedtime.   Yes [provider]  divalproex (DEPAKOTE SPRINKLE) 125 MG capsule Take 250 mg by mouth 2 (two) times daily.   Yes [provider]  ENSURE PLUS (ENSURE PLUS) LIQD Take 237 mLs by mouth 3 (three) times daily between meals.   Yes [provider]  fluticasone (FLONASE) 50 MCG/ACT nasal spray Place 2 sprays into both nostrils daily.   Yes [provider]  furosemide (LASIX) 20 MG tablet Take 20 mg by mouth every other day.    Yes [provider]  loratadine (CLARITIN) 10 MG tablet Take 10 mg by mouth daily with breakfast.   Yes [provider]  Melatonin 3 MG CAPS Take 5 mg by mouth at bedtime.    Yes [provider]  midodrine (PROAMATINE) 2.5 MG tablet Take 2.5 mg by mouth 3 (three) times daily with meals.   Yes [provider]  mirtazapine (REMERON) 7.5 MG tablet Take 7.5 mg by mouth at bedtime.    Yes [provider]  nystatin (NYSTATIN) powder Apply 1 Bottle topically  2 (two) times daily as needed (rash).   Yes [provider]  nystatin cream (MYCOSTATIN) Apply 1 application topically daily as needed for dry skin (rash).   Yes [provider]  pravastatin (PRAVACHOL) 20 MG tablet Take 20 mg by mouth at bedtime.   Yes [provider]  senna-docusate (SENOKOT-S) 8.6-50 MG tablet Take 1 tablet by mouth daily.   Yes [provider]  traZODone (DESYREL) 100 MG tablet Take 100 mg by mouth at bedtime.   Yes [provider]  vitamin B-12 (CYANOCOBALAMIN) 1000 MCG tablet Take 1,000 mcg by mouth daily with breakfast.    Yes [provider]  loperamide (KLS ANTI-DIARRHEAL) 2 MG tablet Take 2-4  mg by mouth daily as needed for diarrhea or loose stools.     [provider]  LORazepam (ATIVAN) 0.5 MG tablet Take 0.5 mg by mouth 2 (two) times daily.     [provider]  metoCLOPramide (REGLAN) 10 MG tablet Take 10 mg by mouth 3 (three) times daily.    [provider]  nitroGLYCERIN (NITROSTAT) 0.4 MG SL tablet Place 0.4 mg under the tongue every 5 (five) minutes as needed for chest pain.    [provider]  polyethylene glycol (MIRALAX / GLYCOLAX) packet Take 17 g by mouth daily as needed for mild constipation or moderate constipation.     [provider]  ZINC OXIDE, TOPICAL, (SECURA PROTECTIVE) 10 % CREA Apply 1 application topically daily as needed (skin irritation).    [provider]    Family History History reviewed. No pertinent family history.  Social History Social History   Tobacco Use  . Smoking status: Never Smoker  . Smokeless tobacco: Never Used  Substance Use Topics  . Alcohol use: No    Frequency: Never  . Drug use: No     Allergies   Patient has no known allergies.   Review of Systems Review of Systems  Unable to perform ROS: Dementia     Physical Exam Updated Vital Signs BP 129/63   Pulse 64   Temp 97.6 F (36.4 C) (Oral)   Resp 19   Ht 5\' 5"  (1.651 m)   Wt 68 kg (150 lb)   SpO2 98%   BMI 24.96 kg/m   Physical Exam  Constitutional: She is oriented to person, place, and time. She appears well-developed. No distress.  Elderly, frail  HENT:  Head: Normocephalic and atraumatic.  Eyes: Conjunctivae and EOM are normal. Pupils are equal, round, and reactive to light.  Neck: Normal range of motion and phonation normal. Neck supple.  Cardiovascular: Normal rate and regular rhythm.  Pulmonary/Chest: Effort normal and breath sounds normal. She exhibits no tenderness.  Abdominal: Soft. She exhibits no distension. There is no tenderness. There is no guarding.  Musculoskeletal: Normal range of  motion.  Neurological: She is alert and oriented to person, place, and time. She exhibits normal muscle tone.  Skin: Skin is warm and dry.  Bilateral buttocks with large area of skin breakdown, skin sloughing, redness and areas of excoriation consistent with stage I pressure sore, complicated by being very large area.  Psychiatric: She has a normal mood and affect. Her behavior is normal. Judgment and thought content normal.  Nursing note and vitals reviewed.    ED Treatments / Results  Labs (all labs ordered are listed, but only abnormal results are displayed) Labs Reviewed  BASIC METABOLIC PANEL - Abnormal; Notable for the following components:  Result Value   Glucose, Bld 118 (*)    BUN 24 (*)    Calcium 8.4 (*)    GFR calc non Af Amer 48 (*)    GFR calc Af Amer 55 (*)    Anion gap 4 (*)    All other components within normal limits  CBC WITH DIFFERENTIAL/PLATELET - Abnormal; Notable for the following components:   RBC 2.95 (*)    Hemoglobin 9.6 (*)    HCT 30.1 (*)    MCV 102.0 (*)    All other components within normal limits  URINALYSIS, ROUTINE W REFLEX MICROSCOPIC - Abnormal; Notable for the following components:   Color, Urine AMBER (*)    APPearance CLOUDY (*)    Hgb urine dipstick SMALL (*)    Protein, ur 100 (*)    Leukocytes, UA LARGE (*)    Bacteria, UA MANY (*)    All other components within normal limits  I-STAT CG4 LACTIC ACID, ED - Abnormal; Notable for the following components:   Lactic Acid, Venous 2.14 (*)    All other components within normal limits  URINE CULTURE  I-STAT CG4 LACTIC ACID, ED    EKG  EKG Interpretation None       Radiology No results found.  Procedures Procedures (including critical care time)  Medications Ordered in ED Medications  Gerhardt's butt cream ( Topical Given 10/04/17 1427)  sodium chloride 0.9 % bolus 500 mL (0 mLs Intravenous Stopped 10/04/17 1320)  cefTRIAXone (ROCEPHIN) 1 g in dextrose 5 % 50 mL IVPB (0  g Intravenous Stopped 10/04/17 1435)  sodium chloride 0.9 % bolus 500 mL (0 mLs Intravenous Stopped 10/04/17 1510)     Initial Impression / Assessment and Plan / ED Course  I have reviewed the triage vital signs and the nursing notes.  Pertinent labs & imaging results that were available during my care of the patient were reviewed by me and considered in my medical decision making (see chart for details).      Patient Vitals for the past 24 hrs:  BP Temp Temp src Pulse Resp SpO2 Height Weight  10/04/17 1700 129/63 - - 64 19 98 % - -  10/04/17 1600 (!) 122/54 - - 65 15 99 % - -  10/04/17 1505 (!) 108/56 - - 65 (!) 24 99 % - -  10/04/17 1400 (!) 117/56 - - 65 - 100 % - -  10/04/17 1300 (!) 105/50 - - 64 16 92 % - -  10/04/17 1100 (!) 94/46 - - 65 18 99 % - -  10/04/17 0932 - - - - - - 5\' 5"  (1.651 m) 68 kg (150 lb)  10/04/17 0922 (!) 102/47 97.6 F (36.4 C) Oral 66 20 100 % - -    -Consult complete with hospitalist. Patient case explained and discussed.  He agrees to admit patient for further evaluation and treatment.   Final Clinical Impressions(s) / ED Diagnoses   Final diagnoses:  Urinary tract infection without hematuria, site unspecified  Pressure injury of buttock, stage 1, unspecified laterality  Yeast infection of the skin    Urinary tract infection, with secondary incontinence and skin breakdown.  Likely yeast infection of the buttocks.  Patient lives in a memory care unit, and requires increased care at this time therefore will need to be hospitalized, for initiation of treatment and determination of additional needs.  Nursing Notes Reviewed/ Care Coordinated Applicable Imaging Reviewed Interpretation of Laboratory Data incorporated into ED treatment  Plan:  Admit  ED Discharge Orders    None       Mancel Bale, MD 10/04/17 [phone]

## 2017-10-04 NOTE — ED Notes (Signed)
Patient bottom/sacral area appears red with some open spots. Patient appears in severe pain when repositioning. Patient depend soiled upon arrival. Patient placed on pure-wick urine device. Patient repositioned on Right side. VSS. Alert and oriented x3.

## 2017-10-05 DIAGNOSIS — N3000 Acute cystitis without hematuria: Secondary | ICD-10-CM

## 2017-10-05 DIAGNOSIS — B9620 Unspecified Escherichia coli [E. coli] as the cause of diseases classified elsewhere: Secondary | ICD-10-CM | POA: Diagnosis present

## 2017-10-05 DIAGNOSIS — B356 Tinea cruris: Secondary | ICD-10-CM | POA: Diagnosis present

## 2017-10-05 DIAGNOSIS — G2581 Restless legs syndrome: Secondary | ICD-10-CM | POA: Diagnosis present

## 2017-10-05 DIAGNOSIS — B372 Candidiasis of skin and nail: Secondary | ICD-10-CM | POA: Diagnosis present

## 2017-10-05 DIAGNOSIS — I9589 Other hypotension: Secondary | ICD-10-CM | POA: Diagnosis present

## 2017-10-05 DIAGNOSIS — A498 Other bacterial infections of unspecified site: Secondary | ICD-10-CM | POA: Diagnosis not present

## 2017-10-05 DIAGNOSIS — E785 Hyperlipidemia, unspecified: Secondary | ICD-10-CM | POA: Diagnosis present

## 2017-10-05 DIAGNOSIS — I130 Hypertensive heart and chronic kidney disease with heart failure and stage 1 through stage 4 chronic kidney disease, or unspecified chronic kidney disease: Secondary | ICD-10-CM | POA: Diagnosis present

## 2017-10-05 DIAGNOSIS — D649 Anemia, unspecified: Secondary | ICD-10-CM | POA: Diagnosis present

## 2017-10-05 DIAGNOSIS — Z950 Presence of cardiac pacemaker: Secondary | ICD-10-CM | POA: Diagnosis not present

## 2017-10-05 DIAGNOSIS — R32 Unspecified urinary incontinence: Secondary | ICD-10-CM | POA: Diagnosis present

## 2017-10-05 DIAGNOSIS — Z79899 Other long term (current) drug therapy: Secondary | ICD-10-CM | POA: Diagnosis not present

## 2017-10-05 DIAGNOSIS — Z7901 Long term (current) use of anticoagulants: Secondary | ICD-10-CM | POA: Diagnosis not present

## 2017-10-05 DIAGNOSIS — K5900 Constipation, unspecified: Secondary | ICD-10-CM | POA: Diagnosis present

## 2017-10-05 DIAGNOSIS — Z1612 Extended spectrum beta lactamase (ESBL) resistance: Secondary | ICD-10-CM | POA: Diagnosis present

## 2017-10-05 DIAGNOSIS — N390 Urinary tract infection, site not specified: Secondary | ICD-10-CM | POA: Diagnosis present

## 2017-10-05 DIAGNOSIS — Z66 Do not resuscitate: Secondary | ICD-10-CM | POA: Diagnosis present

## 2017-10-05 DIAGNOSIS — I5032 Chronic diastolic (congestive) heart failure: Secondary | ICD-10-CM | POA: Diagnosis present

## 2017-10-05 DIAGNOSIS — F0390 Unspecified dementia without behavioral disturbance: Secondary | ICD-10-CM | POA: Diagnosis present

## 2017-10-05 DIAGNOSIS — R110 Nausea: Secondary | ICD-10-CM | POA: Diagnosis not present

## 2017-10-05 DIAGNOSIS — I482 Chronic atrial fibrillation: Secondary | ICD-10-CM | POA: Diagnosis present

## 2017-10-05 DIAGNOSIS — I2510 Atherosclerotic heart disease of native coronary artery without angina pectoris: Secondary | ICD-10-CM | POA: Diagnosis present

## 2017-10-05 DIAGNOSIS — F329 Major depressive disorder, single episode, unspecified: Secondary | ICD-10-CM | POA: Diagnosis present

## 2017-10-05 DIAGNOSIS — J449 Chronic obstructive pulmonary disease, unspecified: Secondary | ICD-10-CM | POA: Diagnosis present

## 2017-10-05 DIAGNOSIS — L89301 Pressure ulcer of unspecified buttock, stage 1: Secondary | ICD-10-CM | POA: Diagnosis not present

## 2017-10-05 DIAGNOSIS — N183 Chronic kidney disease, stage 3 (moderate): Secondary | ICD-10-CM | POA: Diagnosis present

## 2017-10-05 DIAGNOSIS — Z1619 Resistance to other specified beta lactam antibiotics: Secondary | ICD-10-CM | POA: Diagnosis present

## 2017-10-05 LAB — CBC
HEMATOCRIT: 30.8 % — AB (ref 36.0–46.0)
HEMOGLOBIN: 9.7 g/dL — AB (ref 12.0–15.0)
MCH: 32.3 pg (ref 26.0–34.0)
MCHC: 31.5 g/dL (ref 30.0–36.0)
MCV: 102.7 fL — AB (ref 78.0–100.0)
PLATELETS: 206 10*3/uL (ref 150–400)
RBC: 3 MIL/uL — AB (ref 3.87–5.11)
RDW: 14.7 % (ref 11.5–15.5)
WBC: 3.8 10*3/uL — AB (ref 4.0–10.5)

## 2017-10-05 LAB — COMPREHENSIVE METABOLIC PANEL
ALT: 6 U/L — AB (ref 14–54)
AST: 16 U/L (ref 15–41)
Albumin: 2.4 g/dL — ABNORMAL LOW (ref 3.5–5.0)
Alkaline Phosphatase: 50 U/L (ref 38–126)
Anion gap: 7 (ref 5–15)
BUN: 19 mg/dL (ref 6–20)
CHLORIDE: 109 mmol/L (ref 101–111)
CO2: 23 mmol/L (ref 22–32)
CREATININE: 0.78 mg/dL (ref 0.44–1.00)
Calcium: 8.3 mg/dL — ABNORMAL LOW (ref 8.9–10.3)
Glucose, Bld: 102 mg/dL — ABNORMAL HIGH (ref 65–99)
POTASSIUM: 3.9 mmol/L (ref 3.5–5.1)
SODIUM: 139 mmol/L (ref 135–145)
Total Bilirubin: 0.5 mg/dL (ref 0.3–1.2)
Total Protein: 6 g/dL — ABNORMAL LOW (ref 6.5–8.1)

## 2017-10-05 MED ORDER — ADULT MULTIVITAMIN W/MINERALS CH
1.0000 | ORAL_TABLET | Freq: Every day | ORAL | Status: DC
  Administered 2017-10-05 – 2017-10-08 (×4): 1 via ORAL
  Filled 2017-10-05 (×3): qty 1

## 2017-10-05 MED ORDER — ENSURE ENLIVE PO LIQD
237.0000 mL | Freq: Three times a day (TID) | ORAL | Status: DC
  Administered 2017-10-05 – 2017-10-08 (×8): 237 mL via ORAL

## 2017-10-05 MED ORDER — FLUCONAZOLE 100 MG PO TABS
100.0000 mg | ORAL_TABLET | Freq: Every day | ORAL | Status: DC
  Administered 2017-10-05 – 2017-10-08 (×4): 100 mg via ORAL
  Filled 2017-10-05 (×4): qty 1

## 2017-10-05 NOTE — Progress Notes (Signed)
Initial Nutrition Assessment  DOCUMENTATION CODES:   (Will assess for malnutrition at follow-up)  INTERVENTION:  - Will order Ensure Enlive po BID, each supplement provides 350 kcal and 20 grams of protein - Will order daily multivitamin with minerals.  - Continue to encourage PO intakes of meals and supplements.  - Will attempt to complete NFPE at follow-up.   NUTRITION DIAGNOSIS:   Inadequate oral intake related to lethargy/confusion, poor appetite as evidenced by meal completion < 25%, other (comment)(tech's report).  GOAL:   Patient will meet greater than or equal to 90% of their needs  MONITOR:   PO intake, Supplement acceptance, Weight trends, Labs  REASON FOR ASSESSMENT:   Malnutrition Screening Tool  ASSESSMENT:   83 y.o. female past medical history of chronic atrial fibrillation with a pacemaker, dementia and essential hypertension who presents to the ED with buttock pain, most of the history is obtained from chart as patient cannot contribute to her history due to her dementia.,  In the ED she was found to be afebrile vitals are stable mildly elevated lactic acid of 2.1 and a UA with evidence of a UTI.  BMI indicates normal weight/borderline overweight (which is appropriate for age). No intakes documented. Pt sleeping soundly at this time and no family/visitors present. Spoke with tech who reports that pt refused breakfast and lunch meals but that she drank a full Ensure late morning. Tech states plan to order protein items at dinner time and states that pt is able to feed herself; she is hopeful that by having items available pt will at least take a few bites. She states pt has still been confused throughout the day today.   NFPE unable to be done at this time as pt is sleeping and do not want to startle her by attempting physical exam. No weight hx since 01/02/16 when weight was recorded as exactly the same as current weight: 150 lbs/68 kg; unsure if this is entirely  accurate. Dr. Louanne BeltonFeliz's note from earlier today states likely plan for d/c to SNF in 24 hours.   Medications reviewed; 2000 units vitamin D/day, 1 tablet Senokot/day, 1000 mcg vitamin B12/day.  Labs reviewed; Ca: 8.3 mg/dL.    NUTRITION - FOCUSED PHYSICAL EXAM:  Unable to perform/assess at this time and will attempt at follow-up  Diet Order:  Diet Heart Room service appropriate? Yes; Fluid consistency: Thin  EDUCATION NEEDS:   No education needs have been identified at this time  Skin:  Skin Assessment: Reviewed RN Assessment  Last BM:  12/15  Height:   Ht Readings from Last 1 Encounters:  10/04/17 5\' 5"  (1.651 m)    Weight:   Wt Readings from Last 1 Encounters:  10/04/17 150 lb (68 kg)    Ideal Body Weight:  56.82 kg  BMI:  Body mass index is 24.96 kg/m.  Estimated Nutritional Needs:   Kcal:  [id number] (20-23 kcal/kg)  Protein:  50-60 grams  Fluid:  >/= 1.5 L/day      Mikayla GammonJessica Meztli Boyd, Mikayla Boyd, Mikayla Boyd, Mikayla Boyd, Mikayla Boyd Inpatient Clinical Dietitian Pager # [phone] After hours/weekend pager # [phone]

## 2017-10-05 NOTE — Evaluation (Addendum)
Physical Therapy Evaluation Patient Details Name: Mikayla Boyd MRN: [id number] DOB: November 28, 1923 Today's Date: 10/05/2017   History of Present Illness  Mikayla Boyd is a 83 y.o. female with medical history significant of chronic  afib, with a pacemaker, COPD, dementia, hypertension, presents to the emergency room from her assisted living facility with significant buttock pain., pt with UTI and fungal infection  Clinical Impression  Pt admitted with above diagnosis. Pt currently with functional limitations due to the deficits listed below (see PT Problem List). * Pt will benefit from skilled PT to increase their independence and safety with mobility to allow discharge to the venue listed below.    Pt is agitated, borderline combative when encouraged to move, attempting to hit and kick therapist with any imposed movement; pt states she wants "to be left alone"; she is awake and verbally responsive at times however keeps eyes closed and turns head so as to not make eye contact when she does open her eyes; pt appears to have normal muscle tone, with ROM and strength intact per obs, indicating she is ambulatory at baseline; recommend SNF, does not appear pt will be able to return to ALF unless she ahs 24hour one on one care     Follow Up Recommendations SNF    Equipment Recommendations  None recommended by PT    Recommendations for Other Services       Precautions / Restrictions Precautions Precautions: Fall Restrictions Weight Bearing Restrictions: No      Mobility  Bed Mobility Overal bed mobility: Needs Assistance             General bed mobility comments: pt refuses/is uncooperative; multiple attempts to sit EOB or allow pt to roll by multiple staff members during PT session, however pt continues to refuse to move  Transfers                    Ambulation/Gait                Stairs            Wheelchair Mobility    Modified Rankin (Stroke  Patients Only)       Balance Overall balance assessment: (unable to test d/t cognition)                                           Pertinent Vitals/Pain Pain Assessment: Faces Faces Pain Scale: No hurt    Home Living Family/patient expects to be discharged to:: Skilled nursing facility                 Additional Comments: per chart pt is from ALF, she is not a good historian and refuses to answer questions, stating she wants to be left alone    Prior Function Level of Independence: Needs assistance   Gait / Transfers Assistance Needed: pt baseline status is unclear, she is unable to provide info, no family present           Hand Dominance        Extremity/Trunk Assessment   Upper Extremity Assessment Upper Extremity Assessment: Overall WFL for tasks assessed;Difficult to assess due to impaired cognition    Lower Extremity Assessment Lower Extremity Assessment: Overall WFL for tasks assessed;Difficult to assess due to impaired cognition RLE Deficits / Details: pt is resistant to imposed movement, hip and knee extension 4/5 bil;  AROM  grossly WFL per obs however pt is uncooperative with commands for ROM and strength testing       Communication      Cognition Arousal/Alertness: Awake/alert Behavior During Therapy: Agitated Overall Cognitive Status: History of cognitive impairments - at baseline Area of Impairment: Memory                     Memory: Decreased short-term memory Following Commands: Follows one step commands inconsistently       General Comments: pt is uncooperative, difficult to assess cognition; seems to be able to follow 1 step commands however she refuses; has hx of dementia      General Comments      Exercises     Assessment/Plan    PT Assessment Patient needs continued PT services  PT Problem List Decreased activity tolerance;Decreased cognition;Decreased mobility;Decreased safety awareness        PT Treatment Interventions Functional mobility training;Therapeutic exercise;Patient/family education;Gait training;Therapeutic activities    PT Goals (Current goals can be found in the Care Plan section)  Acute Rehab PT Goals Patient Stated Goal: unable to state PT Goal Formulation: Patient unable to participate in goal setting Potential to Achieve Goals: Fair    Frequency Min 2X/week   Barriers to discharge        Co-evaluation               AM-PAC PT "6 Clicks" Daily Activity  Outcome Measure Difficulty turning over in bed (including adjusting bedclothes, sheets and blankets)?: Unable Difficulty moving from lying on back to sitting on the side of the bed? : Unable Difficulty sitting down on and standing up from a chair with arms (e.g., wheelchair, bedside commode, etc,.)?: Unable Help needed moving to and from a bed to chair (including a wheelchair)?: Total Help needed walking in hospital room?: Total Help needed climbing 3-5 steps with a railing? : Total 6 Click Score: 6    End of Session   Activity Tolerance: Treatment limited secondary to agitation Patient left: in bed;with call bell/phone within reach;with bed alarm set Nurse Communication: Mobility status PT Visit Diagnosis: Difficulty in walking, not elsewhere classified (R26.2)    Time: [id number] PT Time Calculation (min) (ACUTE ONLY): 10 min   Charges:   PT Evaluation $PT Eval Moderate Complexity: 1 Mod     PT G CodesDrucilla Chalet:        Rosita Guzzetta, PT Pager: [phone] 10/05/2017   [facility] HospitalWILLIAMS,Toyna Erisman 10/05/2017, 11:45 AM

## 2017-10-05 NOTE — Progress Notes (Signed)
[facility] PROGRESS NOTE    Progress Note  Mikayla Boyd  ZOX:[id number] DOB: 1924/01/03 DOA: 10/04/2017 PCP: Florentina Jennyripp, Henry, MD     Brief Narrative:   Mikayla Boyd is an [age] y.o. female past medical history of chronic atrial fibrillation with a pacemaker, dementia and essential hypertension who presents to the ED with buttock pain, most of the history is obtained from chart as patient cannot contribute to her history due to her dementia.,  In the ED she was found to be afebrile vitals are stable mildly elevated lactic acid of 2.1 and a UA with evidence of a UTI.  Assessment/Plan:   UTI (urinary tract infection)/groin fungal infection: Continue IV Rocephin she has been afebrile mild leukopenia. Will probably need skilled nursing facility consult physical therapy awaiting evaluation. Nystatin powder. Culture data is pending.  Chronic diastolic CHF (congestive heart failure) (HCC)/chronic hypotension Appears euvolemic KVO IV fluids. Continue midrodine. Hold oral Lasix.  Pacemaker:  Atrial fibrillation, chronic (HCC) Rate controlled continue Xarelto.  Dementia without behavioral disturbances Cont depakote  DVT prophylaxis: xarelto Family Communication:Son Disposition Plan/Barrier to D/C: SNF in 24 hrs Code Status:     Code Status Orders  (From admission, onward)        Start     Ordered   10/04/17 2113  Do not attempt resuscitation (DNR)  Continuous    Question Answer Comment  In the event of cardiac or respiratory ARREST Do not call a "code blue"   In the event of cardiac or respiratory ARREST Do not perform Intubation, CPR, defibrillation or ACLS   In the event of cardiac or respiratory ARREST Use medication by any route, position, wound care, and other measures to relive pain and suffering. May use oxygen, suction and manual treatment of airway obstruction as needed for comfort.      10/04/17 2112    Code Status History    Date Active Date Inactive  Code Status Order ID Comments User Context   This patient has a current code status but no historical code status.    Advance Directive Documentation     Most Recent Value  Type of Advance Directive  Out of facility DNR (pink MOST or yellow form)  Pre-existing out of facility DNR order (yellow form or pink MOST form)  No data  "MOST" Form in Place?  No data        IV Access:    Peripheral IV   Procedures and diagnostic studies:   No results found.   Medical Consultants:    None.  Anti-Infectives:   rocephin  Subjective:    Mikayla Boyd he has no complaints.  Objective:    Vitals:   10/04/17 1700 10/04/17 1834 10/04/17 2029 10/04/17 2110  BP: 129/63 127/60 110/60 (!) 110/58  Pulse: 64 65 65 67  Resp: 19 (!) 21 18 20   Temp:    98.5 F (36.9 C)  TempSrc:    Oral  SpO2: 98% 99% 98% 98%  Weight:      Height:        Intake/Output Summary (Last 24 hours) at 10/05/2017 0856 Last data filed at 10/05/2017 0855 Gross per 24 hour  Intake 1850 ml  Output -  Net 1850 ml   Filed Weights   10/04/17 0932  Weight: 68 kg (150 lb)    Exam: General exam: In no acute distress. Respiratory system: Good air movement and clear to auscultation. Cardiovascular system: S1 & S2 heard, RRR. No JVD, murmurs, rubs, gallops or clicks.  Gastrointestinal system: Abdomen is nondistended, soft and nontender.  Central nervous system: Alert and oriented. No focal neurological deficits. Extremities: No pedal edema. Skin: Excoriated sacral area   Data Reviewed:    Labs: Basic Metabolic Panel: Recent Labs  Lab 10/04/17 1101 10/05/17 0518  NA 138 139  K 4.3 3.9  CL 106 109  CO2 28 23  GLUCOSE 118* 102*  BUN 24* 19  CREATININE 1.00 0.78  CALCIUM 8.4* 8.3*   GFR Estimated Creatinine Clearance: 41.2 mL/min (by C-G formula based on SCr of 0.78 mg/dL). Liver Function Tests: Recent Labs  Lab 10/05/17 0518  AST 16  ALT 6*  ALKPHOS 50  BILITOT 0.5  PROT 6.0*    ALBUMIN 2.4*   No results for input(s): LIPASE, AMYLASE in the last 168 hours. No results for input(s): AMMONIA in the last 168 hours. Coagulation profile No results for input(s): INR, PROTIME in the last 168 hours.  CBC: Recent Labs  Lab 10/04/17 1101 10/05/17 0518  WBC 5.4 3.8*  NEUTROABS 3.9  --   HGB 9.6* 9.7*  HCT 30.1* 30.8*  MCV 102.0* 102.7*  PLT 213 206   Cardiac Enzymes: No results for input(s): CKTOTAL, CKMB, CKMBINDEX, TROPONINI in the last 168 hours. BNP (last 3 results) No results for input(s): PROBNP in the last 8760 hours. CBG: No results for input(s): GLUCAP in the last 168 hours. D-Dimer: No results for input(s): DDIMER in the last 72 hours. Hgb A1c: No results for input(s): HGBA1C in the last 72 hours. Lipid Profile: No results for input(s): CHOL, HDL, LDLCALC, TRIG, CHOLHDL, LDLDIRECT in the last 72 hours. Thyroid function studies: No results for input(s): TSH, T4TOTAL, T3FREE, THYROIDAB in the last 72 hours.  Invalid input(s): FREET3 Anemia work up: No results for input(s): VITAMINB12, FOLATE, FERRITIN, TIBC, IRON, RETICCTPCT in the last 72 hours. Sepsis Labs: Recent Labs  Lab 10/04/17 1101 10/04/17 1111 10/04/17 1342 10/05/17 0518  WBC 5.4  --   --  3.8*  LATICACIDVEN  --  2.14* 1.34  --    Microbiology No results found for this or any previous visit (from the past 240 hour(s)).   Medications:   . acetaminophen  1,000 mg Oral BID  . apixaban  5 mg Oral BID  . cholecalciferol  2,000 Units Oral Q breakfast  . citalopram  20 mg Oral QHS  . divalproex  250 mg Oral BID  . fluticasone  2 spray Each Nare Daily  . Gerhardt's butt cream   Topical Q4H  . loratadine  10 mg Oral Q breakfast  . midodrine  2.5 mg Oral TID WC  . mirtazapine  7.5 mg Oral QHS  . pravastatin  20 mg Oral QHS  . senna-docusate  1 tablet Oral Daily  . traZODone  100 mg Oral QHS  . vitamin B-12  1,000 mcg Oral Q breakfast   Continuous Infusions: . sodium chloride  75 mL/hr at 10/04/17 2214  . cefTRIAXone (ROCEPHIN)  IV    . fluconazole (DIFLUCAN) IV Stopped (10/04/17 2314)     LOS: 0 days   Marinda ElkAbraham Feliz Ortiz  [facility] Pager [phone]  *Please refer to [email or web address], password [email or web address] to get updated schedule on who will round on this patient, as hospitalists switch teams weekly. If 7PM-7AM, please contact night-coverage at [email or web address], password [email or web address] for any overnight needs.  10/05/2017, 8:56 AM

## 2017-10-05 NOTE — [place] FL2 (Signed)
[place] MEDICAID FL2 LEVEL OF CARE SCREENING TOOL     IDENTIFICATION  Patient Name: Mikayla BumpGwendolyn Gulick Birthdate: [birth year] Sex: female Admission Date (Current Location): 10/04/2017  [facility] [place]   Facility and Address:  [facility],  [address], [place] [address]      Provider Number: [id number]  Attending Physician Name and Address:  Marinda ElkFeliz Ortiz, Abraham, MD  Relative Name and Phone Number:       Current Level of Care: Hospital Recommended Level of Care: Skilled Nursing Facility Prior Approval Number:    Date Approved/Denied:   PASRR Number:    Discharge Plan: SNF    Current Diagnoses: Patient Active Problem List   Diagnosis Date Noted  . Pressure injury of buttock, stage 1   . UTI (urinary tract infection) 10/04/2017  . Chronic diastolic CHF (congestive heart failure) (HCC) 10/04/2017  . Pacemaker 10/04/2017  . Atrial fibrillation, chronic (HCC) 10/04/2017  . Dementia 10/04/2017  . Acute lower UTI 10/04/2017  . Fungal infection of the groin 10/04/2017    Orientation RESPIRATION BLADDER Height & Weight     (Memory Impairment)  Normal Incontinent, External catheter Weight: 150 lb (68 kg) Height:  5\' 5"  (165.1 cm)  BEHAVIORAL SYMPTOMS/MOOD NEUROLOGICAL BOWEL NUTRITION STATUS      Continent Diet(Heart Healthy )  AMBULATORY STATUS COMMUNICATION OF NEEDS Skin   Extensive Assist Verbally (Pressure Injury Stage 1) Moisture Associated Skin Damage-Coccyx, Anus, Labia Right left bilateral )                       Personal Care Assistance Level of Assistance  Bathing, Feeding, Dressing Bathing Assistance: maximum assistance Feeding assistance: limited assistance  Dressing Assistance: maximum assistance     Functional Limitations Info  Sight, Hearing, Speech Sight Info: Adequate Hearing Info: Adequate Speech Info: Adequate    SPECIAL CARE FACTORS FREQUENCY  PT (By licensed PT), OT (By licensed OT)     PT  Frequency: 5x/week OT Frequency: 5x/week            Contractures Contractures Info: Not present    Additional Factors Info  Psychotropic Code Status Info: DNR Allergies Info: Allergies: No Known Allergies Psychotropic Info: Celexa, Depakote,Remeron, Trazadone         Current Medications (10/05/2017):  This is the current hospital active medication list Current Facility-Administered Medications  Medication Dose Route Frequency Provider Last Rate Last Dose  . 0.9 %  sodium chloride infusion   Intravenous Continuous Marinda ElkFeliz Ortiz, Abraham, MD 10 mL/hr at 10/05/17 0903 10 mL/hr at 10/05/17 0903  . acetaminophen (TYLENOL) tablet 1,000 mg  1,000 mg Oral BID Leatha GildingGherghe, Costin M, MD   1,000 mg at 10/05/17 0857  . apixaban (ELIQUIS) tablet 5 mg  5 mg Oral BID Leatha GildingGherghe, Costin M, MD   5 mg at 10/05/17 0858  . cefTRIAXone (ROCEPHIN) 1 g in dextrose 5 % 50 mL IVPB  1 g Intravenous Q24H Leatha GildingGherghe, Costin M, MD   Stopped at 10/05/17 1235  . cholecalciferol (VITAMIN D) tablet 2,000 Units  2,000 Units Oral Q breakfast Leatha GildingGherghe, Costin M, MD   2,000 Units at 10/05/17 [phone]  . citalopram (CELEXA) tablet 20 mg  20 mg Oral QHS Leatha GildingGherghe, Costin M, MD   20 mg at 10/04/17 2215  . divalproex (DEPAKOTE SPRINKLE) capsule 250 mg  250 mg Oral BID Leatha GildingGherghe, Costin M, MD   250 mg at 10/05/17 0900  . feeding supplement (ENSURE ENLIVE) (ENSURE ENLIVE) liquid 237  mL  237 mL Oral TID BM Marinda ElkFeliz Ortiz, Abraham, MD      . fluconazole (DIFLUCAN) tablet 100 mg  100 mg Oral Daily Len ChildsBell, Michelle T, RPH   100 mg at 10/05/17 1205  . fluticasone (FLONASE) 50 MCG/ACT nasal spray 2 spray  2 spray Each Nare Daily Gherghe, Costin M, MD      . Gerhardt's butt cream   Topical Q4H Mancel BaleWentz, Elliott, MD      . loratadine (CLARITIN) tablet 10 mg  10 mg Oral Q breakfast Leatha GildingGherghe, Costin M, MD   10 mg at 10/05/17 0857  . midodrine (PROAMATINE) tablet 2.5 mg  2.5 mg Oral TID WC Leatha GildingGherghe, Costin M, MD   2.5 mg at 10/05/17 1205  . mirtazapine (REMERON)  tablet 7.5 mg  7.5 mg Oral QHS Leatha GildingGherghe, Costin M, MD   7.5 mg at 10/04/17 2214  . multivitamin with minerals tablet 1 tablet  1 tablet Oral Daily Marinda ElkFeliz Ortiz, Abraham, MD      . nystatin (MYCOSTATIN/NYSTOP) topical powder 1 Bottle  1 Bottle Topical BID PRN Leatha GildingGherghe, Costin M, MD      . polyethylene glycol (MIRALAX / GLYCOLAX) packet 17 g  17 g Oral Daily PRN Leatha GildingGherghe, Costin M, MD      . pravastatin (PRAVACHOL) tablet 20 mg  20 mg Oral QHS Leatha GildingGherghe, Costin M, MD   20 mg at 10/04/17 2214  . senna-docusate (Senokot-S) tablet 1 tablet  1 tablet Oral Daily Leatha GildingGherghe, Costin M, MD   1 tablet at 10/05/17 0857  . traZODone (DESYREL) tablet 100 mg  100 mg Oral QHS Leatha GildingGherghe, Costin M, MD   100 mg at 10/04/17 2215  . vitamin B-12 (CYANOCOBALAMIN) tablet 1,000 mcg  1,000 mcg Oral Q breakfast Leatha GildingGherghe, Costin M, MD   1,000 mcg at 10/05/17 0858  . zinc oxide 20 % ointment 1 application  1 application Topical Daily PRN Leatha GildingGherghe, Costin M, MD         Discharge Medications: Please see discharge summary for a list of discharge medications.  Relevant Imaging Results:  Relevant Lab Results:   Additional Information ssn:[id number]  Clearance CootsNicole A Habiba Treloar, LCSW

## 2017-10-05 NOTE — Progress Notes (Signed)
PHARMACIST - PHYSICIAN COMMUNICATION DR:   Mikayla Boyd CONCERNING: Antibiotic IV to Oral Route Change Policy  RECOMMENDATION: This patient is receiving diflucan by the intravenous route.  Based on criteria approved by the Pharmacy and Therapeutics Committee, the antibiotic(s) is/are being converted to the equivalent oral dose form(s).   DESCRIPTION: These criteria include:  Patient being treated for a respiratory tract infection, urinary tract infection, cellulitis or clostridium difficile associated diarrhea if on metronidazole  The patient is not neutropenic and does not exhibit a GI malabsorption state  The patient is eating (either orally or via tube) and/or has been taking other orally administered medications for a least 24 hours  The patient is improving clinically and has a Tmax < 100.5  If you have questions about this conversion, please contact the Pharmacy Department  []   [phone]( [phone] )  Mikayla Boyd []   [phone]( [phone]  Mikayla Boyd []   [phone]  Mikayla Boyd []   [phone]( [phone]  Mikayla Boyd [x]   [phone]( [phone] )  [facility]  Herby AbrahamMichelle T. Sana Boyd, [place]arm.D. [phone] 10/05/2017 11:20 AM

## 2017-10-05 NOTE — Progress Notes (Signed)
CSW following for discharge needs to SNF. CSW call pt. Son, left voicemail.  Waiting for return call to discuss D/C plan to SNF.   Vivi BarrackNicole Baden Betsch, Theresia MajorsLCSWA, MSW Clinical Social Worker  [phone] 10/05/2017  12:28 PM

## 2017-10-05 NOTE — Clinical Social Work Note (Signed)
Clinical Social Work Assessment  Patient Details  Name: Mikayla Boyd MRN: [id number] Date of Birth: 1923-11-03  Date of referral:  10/05/17               Reason for consult:  Facility Placement                Permission sought to share information with:  Family Supports, Magazine features editoracility Contact Representative Permission granted to share information::     Name::        Agency::  SNF  Relationship::  [name]   Contact Information:  [phone]  Housing/Transportation Living arrangements for the past 2 months:  Assisted Living Facility([facility] ) Source of Information:  Adult Children([name]) Patient Interpreter Needed:  None Criminal Activity/Legal Involvement Pertinent to Current Situation/Hospitalization:  No - Comment as needed Significant Relationships:  Adult Children Lives with:  Facility Resident Do you feel safe going back to the place where you live?  Yes Need for family participation in patient care:  Yes (Depdenent w/ mobility)  Care giving concerns:  Patient admitted for Buttock pain and UTI.  Patient has dementia. Physician is recommending a SNF placement. Patient evaluated by physical therapy and recommends SNF placement, 24 hour supervision.     Social Worker assessment / plan:  Patient has dementia and is oriented to self and place. CSW called and discussed discharge planning to SNF with patient son. He reports having his own health issues, and currently receiving rehab.   He reports the patient has been living at [facility] for the past two years. He reports the patient is now a resident at there memory unit.  CSW explain SNF and Long Term Bed process for placement. Patient son reports if there is not a LTC/[place]icaid bed in [place] he prefers the patient go back to [facility].     CSW completed FL2, Started PASRR.   Plan: Assist with to SNF   Employment status:  Retired Health and safety inspectornsurance information:  Medicare PT Recommendations:    Information /  Referral to community resources:  Skilled Nursing Facility  Patient/Family's Response to care:  Patient son only agreeable to SNF LTC in [place] area.   Patient/Family's Understanding of and Emotional Response to Diagnosis, Current Treatment, and Prognosis: Patient son is very knowledgeable of pt. diagnosis and care.    Emotional Assessment Appearance:    Attitude/Demeanor/Rapport:  Unable to Assess Affect (typically observed):  Unable to Assess Orientation:  Oriented to Self Alcohol / Substance use:  Not Applicable Psych involvement (Current and /or in the community):     Discharge Needs  Concerns to be addressed:  Discharge Planning Concerns Readmission within the last 30 days:  No Current discharge risk:  Dependent with Mobility, Cognitively Impaired Barriers to Discharge:  Continued Medical Work up   [employer] A Mikayla Teaney, LCSW 10/05/2017, 1:41 PM

## 2017-10-06 ENCOUNTER — Inpatient Hospital Stay (HOSPITAL_COMMUNITY): Payer: Medicare Other

## 2017-10-06 LAB — URINE CULTURE: Culture: >=100000 — AB

## 2017-10-06 MED ORDER — FOSFOMYCIN TROMETHAMINE 3 G PO PACK
3.0000 g | PACK | Freq: Once | ORAL | Status: AC
  Administered 2017-10-06: 14:00:00 3 g via ORAL
  Filled 2017-10-06: qty 3

## 2017-10-06 MED ORDER — PIPERACILLIN-TAZOBACTAM 3.375 G IVPB 30 MIN: 3.3750 g | Freq: Three times a day (TID) | INTRAVENOUS | Status: DC

## 2017-10-06 MED ORDER — LIP MEDEX EX OINT
TOPICAL_OINTMENT | CUTANEOUS | Status: AC
  Administered 2017-10-06: 1
  Filled 2017-10-06: qty 7

## 2017-10-06 MED ORDER — AMOXICILLIN-POT CLAVULANATE 875-125 MG PO TABS: 1.0000 | ORAL_TABLET | Freq: Two times a day (BID) | ORAL | Status: DC

## 2017-10-06 MED ORDER — SODIUM CHLORIDE 0.9 % IV SOLN
INTRAVENOUS | Status: AC
  Administered 2017-10-06: 75 mL via INTRAVENOUS

## 2017-10-06 MED ORDER — PIPERACILLIN-TAZOBACTAM 3.375 G IVPB
3.3750 g | Freq: Three times a day (TID) | INTRAVENOUS | Status: DC
  Filled 2017-10-06: qty 50

## 2017-10-06 MED ORDER — ALBUTEROL SULFATE (2.5 MG/3ML) 0.083% IN NEBU
2.5000 mg | INHALATION_SOLUTION | Freq: Once | RESPIRATORY_TRACT | Status: AC
  Administered 2017-10-06: 2.5 mg via RESPIRATORY_TRACT
  Filled 2017-10-06: qty 3

## 2017-10-06 NOTE — Progress Notes (Signed)
[facility] PROGRESS NOTE    Progress Note  Mikayla BumpGwendolyn Sova  WUJ:[id number] DOB: [birth year] DOA: 10/04/2017 PCP: Florentina Jennyripp, Henry, MD     Brief Narrative:   Mikayla Boyd is an 83 y.o. female past medical history of chronic atrial fibrillation with a pacemaker, dementia and essential hypertension who presents to the ED with buttock pain, most of the history is obtained from chart as patient cannot contribute to her history due to her dementia.,  In the ED she was found to be afebrile vitals are stable mildly elevated lactic acid of 2.1 and a UA with evidence of a UTI.  Assessment/Plan:   UTI (urinary tract infection)/groin fungal infection: Urine culture came bac E. coli resistant to Rocephin we will change her to IV Zosyn. Physical therapy evaluated the patient to recommend a skilled nursing facility. Nystatin powder.  She still has poor oral intake, in the setting of an active infection with a partially treated UTI as her urine culture grew E. coli resistant to Rocephin.  Chronic diastolic CHF (congestive heart failure) (HCC)/chronic hypotension Poor oral intake resume IV fluids. Continue midrodine. Hold oral Lasix.  Pacemaker:  Atrial fibrillation, chronic (HCC) Rate controlled continue Xarelto.  Dementia without behavioral disturbances Cont depakote  DVT prophylaxis: xarelto Family Communication:Son Disposition Plan/Barrier to D/C: SNF in 24 hrs. Code Status:     Code Status Orders  (From admission, onward)        Start     Ordered   10/04/17 2113  Do not attempt resuscitation (DNR)  Continuous    Question Answer Comment  In the event of cardiac or respiratory ARREST Do not call a "code blue"   In the event of cardiac or respiratory ARREST Do not perform Intubation, CPR, defibrillation or ACLS   In the event of cardiac or respiratory ARREST Use medication by any route, position, wound care, and other measures to relive pain and suffering. May use oxygen,  suction and manual treatment of airway obstruction as needed for comfort.      10/04/17 2112    Code Status History    Date Active Date Inactive Code Status Order ID Comments User Context   This patient has a current code status but no historical code status.    Advance Directive Documentation     Most Recent Value  Type of Advance Directive  Out of facility DNR (pink MOST or yellow form)  Pre-existing out of facility DNR order (yellow form or pink MOST form)  No data  "MOST" Form in Place?  No data        IV Access:    Peripheral IV   Procedures and diagnostic studies:   No results found.   Medical Consultants:    None.  Anti-Infectives:   rocephin  Subjective:    Happy Lutze he has no complaints.  Objective:    Vitals:   10/05/17 0850 10/05/17 1409 10/05/17 2245 10/06/17 0646  BP: (!) 122/46 (!) 102/50 (!) 112/49 (!) 115/50  Pulse: 72 65 64 (!) 59  Resp:  20 17 17   Temp:  98.2 F (36.8 C) 97.8 F (36.6 C) 97.9 F (36.6 C)  TempSrc:  Axillary Axillary Axillary  SpO2:  96% 95% 94%  Weight:      Height:        Intake/Output Summary (Last 24 hours) at 10/06/2017 1205 Last data filed at 10/06/2017 0547 Gross per 24 hour  Intake 730 ml  Output -  Net 730 ml   [employer]ights  10/04/17 0932  Weight: 68 kg (150 lb)    Exam: General exam: In no acute distress. Respiratory system: Good air movement and clear to auscultation. Cardiovascular system: S1 & S2 heard, RRR. No JVD, murmurs, rubs, gallops or clicks.  Gastrointestinal system: Abdomen is nondistended, soft and nontender.  Central nervous system: Alert and oriented. No focal neurological deficits. Extremities: No pedal edema. Skin: Excoriated sacral area   Data Reviewed:    Labs: Basic Metabolic Panel: Recent Labs  Lab 10/04/17 1101 10/05/17 0518  NA 138 139  K 4.3 3.9  CL 106 109  CO2 28 23  GLUCOSE 118* 102*  BUN 24* 19  CREATININE 1.00 0.78  CALCIUM 8.4* 8.3*    GFR Estimated Creatinine Clearance: 41.2 mL/min (by C-G formula based on SCr of 0.78 mg/dL). Liver Function Tests: Recent Labs  Lab 10/05/17 0518  AST 16  ALT 6*  ALKPHOS 50  BILITOT 0.5  PROT 6.0*  ALBUMIN 2.4*   No results for input(s): LIPASE, AMYLASE in the last 168 hours. No results for input(s): AMMONIA in the last 168 hours. Coagulation profile No results for input(s): INR, PROTIME in the last 168 hours.  CBC: Recent Labs  Lab 10/04/17 1101 10/05/17 0518  WBC 5.4 3.8*  NEUTROABS 3.9  --   HGB 9.6* 9.7*  HCT 30.1* 30.8*  MCV 102.0* 102.7*  PLT 213 206   Cardiac Enzymes: No results for input(s): CKTOTAL, CKMB, CKMBINDEX, TROPONINI in the last 168 hours. BNP (last 3 results) No results for input(s): PROBNP in the last 8760 hours. CBG: No results for input(s): GLUCAP in the last 168 hours. D-Dimer: No results for input(s): DDIMER in the last 72 hours. Hgb A1c: No results for input(s): HGBA1C in the last 72 hours. Lipid Profile: No results for input(s): CHOL, HDL, LDLCALC, TRIG, CHOLHDL, LDLDIRECT in the last 72 hours. Thyroid function studies: No results for input(s): TSH, T4TOTAL, T3FREE, THYROIDAB in the last 72 hours.  Invalid input(s): FREET3 Anemia work up: No results for input(s): VITAMINB12, FOLATE, FERRITIN, TIBC, IRON, RETICCTPCT in the last 72 hours. Sepsis Labs: Recent Labs  Lab 10/04/17 1101 10/04/17 1111 10/04/17 1342 10/05/17 0518  WBC 5.4  --   --  3.8*  LATICACIDVEN  --  2.14* 1.34  --    Microbiology Recent Results (from the past 240 hour(s))  Urine culture     Status: Abnormal   Collection Time: 10/04/17 11:34 AM  Result Value Ref Range Status   Specimen Description URINE, CATHETERIZED  Final   Special Requests NONE  Final   Culture (A)  Final    >=100,000 COLONIES/mL ESCHERICHIA COLI Confirmed Extended Spectrum Beta-Lactamase Producer (ESBL).  In bloodstream infections from ESBL organisms, carbapenems are preferred over  piperacillin/tazobactam. They are shown to have a lower risk of mortality. Performed at [facility], [address] [place], [place] [address]    Report Status 10/06/2017 FINAL  Final   Organism ID, Bacteria ESCHERICHIA COLI (A)  Final      Susceptibility   Escherichia coli - MIC*    AMPICILLIN >=32 RESISTANT Resistant     CEFAZOLIN >=64 RESISTANT Resistant     CEFTRIAXONE RESISTANT Resistant     CIPROFLOXACIN >=4 RESISTANT Resistant     GENTAMICIN <=1 SENSITIVE Sensitive     IMIPENEM <=0.25 SENSITIVE Sensitive     NITROFURANTOIN 64 INTERMEDIATE Intermediate     TRIMETH/SULFA >=320 RESISTANT Resistant     AMPICILLIN/SULBACTAM 8 SENSITIVE Sensitive     PIP/TAZO <=4  SENSITIVE Sensitive     Extended ESBL POSITIVE Resistant     * >=100,000 COLONIES/mL ESCHERICHIA COLI     Medications:   . acetaminophen  1,000 mg Oral BID  . apixaban  5 mg Oral BID  . cholecalciferol  2,000 Units Oral Q breakfast  . citalopram  20 mg Oral QHS  . divalproex  250 mg Oral BID  . feeding supplement (ENSURE ENLIVE)  237 mL Oral TID BM  . fluconazole  100 mg Oral Daily  . fluticasone  2 spray Each Nare Daily  . Gerhardt's butt cream   Topical Q4H  . loratadine  10 mg Oral Q breakfast  . midodrine  2.5 mg Oral TID WC  . mirtazapine  7.5 mg Oral QHS  . multivitamin with minerals  1 tablet Oral Daily  . pravastatin  20 mg Oral QHS  . senna-docusate  1 tablet Oral Daily  . traZODone  100 mg Oral QHS  . vitamin B-12  1,000 mcg Oral Q breakfast   Continuous Infusions: . sodium chloride 10 mL/hr at 10/06/17 0547  . cefTRIAXone (ROCEPHIN)  IV Stopped (10/05/17 1235)     LOS: 1 day   Marinda Elk  [facility] Pager [phone]  *Please refer to [email or web address], password [email or web address] to get updated schedule on who will round on this patient, as hospitalists switch teams weekly. If 7PM-7AM, please contact night-coverage at [email or web address], password [email or web address] for any overnight needs.  10/06/2017, 12:05  PM

## 2017-10-06 NOTE — Progress Notes (Signed)
CSW called son to provide list of bed offers, left voicemail.   Mikayla BarrackNicole Zyion Boyd, Mikayla MajorsLCSWA, MSW Clinical Social Worker  [phone] 10/06/2017  10:37 AM

## 2017-10-06 NOTE — Progress Notes (Addendum)
Physical Therapy Treatment Patient Details Name: Mikayla BumpGwendolyn Lickteig MRN: [id number] DOB: 1923-11-15 Today's Date: 10/06/2017    History of Present Illness Mikayla BumpGwendolyn Doten is a 83 y.o. female with medical history significant of chronic  afib, with a pacemaker, COPD, dementia, hypertension, presents to the emergency room from her assisted living facility with significant buttock pain., pt with UTI and fungal infection    PT Comments    The patient participated in  Bed mobility and [facility], then back to bed. Much pain involved with sitting and pericare based on  Patient's reaction and not wanting wash cloth to be used. Continue PT mobility efforts.    Patient incontinent  Prior to mobilizing out of bed. CNA aware.   Follow Up Recommendations  SNF     Equipment Recommendations  None recommended by PT    Recommendations for Other Services       Precautions / Restrictions Precautions Precautions: Fall Precaution Comments: severe excoriation of buttocks with pain Restrictions Weight Bearing Restrictions: No    Mobility  Bed Mobility Overal bed mobility: Needs Assistance Bed Mobility: Sidelying to Sit;Sit to Sidelying   Sidelying to sit: Min assist     Sit to sidelying: Min assist General bed mobility comments: the patient  made 2 attempts to  sit up, second  attempt required no assistance. Assisted  self back into bed.  Transfers Overall transfer level: Needs assistance Equipment used: Rolling walker (2 wheeled) Transfers: Sit to/from [employer]Stand;Stand Pivot Transfers Sit to Stand: Mod assist Stand pivot transfers: Mod assist       General transfer comment: assist to rise and [facility] and [facility] to bed, [facility].   Ambulation/Gait                 Stairs            Wheelchair Mobility    Modified Rankin (Stroke Patients Only)       Balance                                            Cognition  Arousal/Alertness: Awake/alert   Overall Cognitive Status: Within Functional Limits for tasks assessed                                 General Comments: patient cooperative today      Exercises      General Comments        Pertinent Vitals/Pain Faces Pain Scale: Hurts worst Pain Location: indicates buttocks, did not want  to be cleaned up due to pain Pain Descriptors / Indicators: Burning;Discomfort;Grimacing;Guarding Pain Intervention(s): Repositioned;Limited activity within patient's tolerance    Home Living                      Prior Function            PT Goals (current goals can now be found in the care plan section) Progress towards PT goals: Progressing toward goals    Frequency    Min 2X/week      PT Plan Current plan remains appropriate    Co-evaluation              AM-PAC PT "6 Clicks" Daily Activity  Outcome Measure  Difficulty turning over in bed (including adjusting bedclothes,  sheets and blankets)?: A Lot Difficulty moving from lying on back to sitting on the side of the bed? : A Lot Difficulty sitting down on and standing up from a chair with arms (e.g., wheelchair, bedside commode, etc,.)?: A Lot Help needed moving to and from a bed to chair (including a wheelchair)?: A Lot Help needed walking in hospital room?: Total Help needed climbing 3-5 steps with a railing? : Total 6 Click Score: 10    End of Session   Activity Tolerance: Patient limited by pain Patient left: in bed;with call bell/phone within reach;with bed alarm set Nurse Communication: Mobility status PT Visit Diagnosis: Difficulty in walking, not elsewhere classified (R26.2)     Time: [id number] PT Time Calculation (min) (ACUTE ONLY): 10 min  Charges:  $Gait Training: 8-22 mins $Therapeutic Activity: 8-22 mins                    G CodesBlanchard Kelch:       Willi Borowiak PT [phone]    Rada HayHill, Aviv Lengacher Elizabeth 10/06/2017, 10:33 AM

## 2017-10-07 NOTE — Evaluation (Signed)
Clinical/Bedside Swallow Evaluation Patient Details  Name: Mikayla BumpGwendolyn Macphail MRN: [id number] Date of Birth: [birth year]  Today's Date: 10/07/2017 Time: SLP Start Time (ACUTE ONLY): 1007 SLP Stop Time (ACUTE ONLY): 1019 SLP Time Calculation (min) (ACUTE ONLY): 12 min  Past Medical History:  Past Medical History:  Diagnosis Date  . A-fib (HCC)   . CHF (congestive heart failure) (HCC)   . CKD (chronic kidney disease)   . COPD (chronic obstructive pulmonary disease) (HCC)   . Coronary artery disease   . Dementia   . Depression   . Dyslipidemia   . Hypertension   . Pacemaker   . RLS (restless legs syndrome)   . Valvular heart disease   . Vertigo    Past Surgical History:  Past Surgical History:  Procedure Laterality Date  . PACEMAKER INSERTION     HPI:  Mikayla Boyd is a 83 y.o. female with medical history significant of chronic afib, pacemaker, COPD, dementia, hypertension, presents to the emergency room from her assisted living facility with significant buttock pain. Found to have UTI and fungal infection. CXR mild cardiomegaly. No active cardiopulmonary disease   Assessment / Plan / Recommendation Clinical Impression  Oral motor exam unremarkable. RN stated pt began wheezing following po's yesterday dissipating following breathing treatment. Normal oral and pharyngeal stages of swallow function exhibited across textures. Strong volitional cough. Recent CXR clear. COPD does increase pt's risk for aspiration. Recommend regular texture, thin liquids, pills with thin and following safe swallow precautions. No further ST needed.   SLP Visit Diagnosis: Dysphagia, unspecified (R13.10)    Aspiration Risk  Mild aspiration risk    Diet Recommendation Regular;Thin liquid   Liquid Administration via: Straw;Cup Medication Administration: Whole meds with liquid Supervision: Patient able to self feed;Intermittent supervision to cue for compensatory strategies Compensations: Slow  rate;Small sips/bites Postural Changes: Seated upright at 90 degrees    Other  Recommendations Oral Care Recommendations: Oral care BID   Follow up Recommendations None      Frequency and Duration            Prognosis        Swallow Study   General HPI: Mikayla Boyd is a 83 y.o. female with medical history significant of chronic afib, pacemaker, COPD, dementia, hypertension, presents to the emergency room from her assisted living facility with significant buttock pain. Found to have UTI and fungal infection. CXR mild cardiomegaly. No active cardiopulmonary disease Type of Study: Bedside Swallow Evaluation Previous Swallow Assessment: (none) Diet Prior to this Study: NPO Temperature Spikes Noted: No Respiratory Status: Room air History of Recent Intubation: No Behavior/Cognition: Alert;Cooperative;Pleasant mood;Requires cueing Oral Cavity Assessment: Within Functional Limits Oral Care Completed by SLP: No Oral Cavity - Dentition: Adequate natural dentition Vision: Functional for self-feeding Self-Feeding Abilities: Able to feed self Patient Positioning: Upright in bed Baseline Vocal Quality: Normal Volitional Cough: Strong Volitional Swallow: Able to elicit    Oral/Motor/Sensory Function Overall Oral Motor/Sensory Function: Within functional limits   Ice Chips Ice chips: Not tested   Thin Liquid Thin Liquid: Within functional limits Presentation: Cup;Straw    Nectar Thick Nectar Thick Liquid: Not tested   Honey Thick Honey Thick Liquid: Not tested   Puree Puree: Within functional limits   Solid   GO   Solid: Within functional limits        Roque CashLitaker, Breck CoonsLisa Willis 10/07/2017,10:25 AM  Breck CoonsLisa Willis Lonell FaceLitaker M.Ed [employer]SLP Pager [phone]

## 2017-10-07 NOTE — Progress Notes (Signed)
[facility] PROGRESS NOTE    Progress Note  Mikayla Boyd  ZOX:[id number] DOB: June 26, 1924 DOA: 10/04/2017 PCP: Florentina Jenny, MD     Brief Narrative:   Mikayla Boyd is an 83 y.o. female past medical history of chronic atrial fibrillation with a pacemaker, dementia and essential hypertension who presents to the ED with buttock pain, most of the history is obtained from chart as patient cannot contribute to her history due to her dementia.,  In the ED she was found to be afebrile vitals are stable mildly elevated lactic acid of 2.1 and a UA with evidence of a UTI.  Assessment/Plan:   UTI (urinary tract infection)/groin fungal infection: Urine culture came bac E. coli resistant to Rocephin we will change her to fosfomycin Physical therapy evaluated the patient to recommend a skilled nursing facility. Nystatin powder.  She still has poor oral intake, in the setting of an active infection with a partially treated UTI.  Chronic diastolic CHF (congestive heart failure) (HCC)/chronic hypotension Poor oral intake resume IV fluids. Continue midrodine. Hold oral Lasix.  Pacemaker:  Atrial fibrillation, chronic (HCC) Rate controlled continue Xarelto.  Dementia without behavioral disturbances Cont depakote  DVT prophylaxis: xarelto Family Communication:Son Disposition Plan/Barrier to D/C: SNF in the morning Code Status:     Code Status Orders  (From admission, onward)        Start     Ordered   10/04/17 2113  Do not attempt resuscitation (DNR)  Continuous    Question Answer Comment  In the event of cardiac or respiratory ARREST Do not call a "code blue"   In the event of cardiac or respiratory ARREST Do not perform Intubation, CPR, defibrillation or ACLS   In the event of cardiac or respiratory ARREST Use medication by any route, position, wound care, and other measures to relive pain and suffering. May use oxygen, suction and manual treatment of airway obstruction  as needed for comfort.      10/04/17 2112    Code Status History    Date Active Date Inactive Code Status Order ID Comments User Context   This patient has a current code status but no historical code status.    Advance Directive Documentation     Most Recent Value  Type of Advance Directive  Out of facility DNR (pink MOST or yellow form)  Pre-existing out of facility DNR order (yellow form or pink MOST form)  No data  "MOST" Form in Place?  No data        IV Access:    Peripheral IV   Procedures and diagnostic studies:   Dg Chest Port 1 View  Result Date: 10/06/2017 CLINICAL DATA:  Wheezing. EXAM: PORTABLE CHEST 1 VIEW COMPARISON:  None. FINDINGS: Left chest wall pacer device with single lead terminating in the right ventricle. Mild cardiomegaly. Atherosclerotic calcification of the aortic arch. Normal pulmonary vascularity. No focal consolidation, pleural effusion, or pneumothorax. No acute osseous abnormality. IMPRESSION: 1. Mild cardiomegaly.  No active cardiopulmonary disease. 2.  Aortic atherosclerosis (ICD10-I70.0). Electronically Signed   By: Obie Dredge M.D.   On: 10/06/2017 15:43     Medical Consultants:    None.  Anti-Infectives:   rocephin  Subjective:    Mikayla Boyd he has no complains except for nausea  Objective:    Vitals:   10/06/17 0646 10/06/17 1410 10/06/17 2138 10/07/17 0534  BP: (!) 115/50 (!) 116/44 (!) 124/50 (!) 114/39  Pulse: (!) 59 64 65 65  Resp: 17 16 16  14  Temp: 97.9 F (36.6 C) 97.6 F (36.4 C) 98.5 F (36.9 C) 97.8 F (36.6 C)  TempSrc: Axillary Oral Oral Oral  SpO2: 94% 99% 98% 98%  Weight:      Height:        Intake/Output Summary (Last 24 hours) at 10/07/2017 1117 Last data filed at 10/07/2017 0914 Gross per 24 hour  Intake 720 ml  Output 900 ml  Net -180 ml   Filed Weights   10/04/17 0932  Weight: 68 kg (150 lb)    Exam: General exam: In no acute distress. Respiratory system: Good air  movement and clear to auscultation. Cardiovascular system: S1 & S2 heard, RRR. No JVD, murmurs, rubs, gallops or clicks.  Gastrointestinal system: Abdomen is nondistended, soft and nontender.  Central nervous system: Alert and oriented. No focal neurological deficits. Extremities: No pedal edema. Skin: Excoriated sacral area   Data Reviewed:    Labs: Basic Metabolic Panel: Recent Labs  Lab 10/04/17 1101 10/05/17 0518  NA 138 139  K 4.3 3.9  CL 106 109  CO2 28 23  GLUCOSE 118* 102*  BUN 24* 19  CREATININE 1.00 0.78  CALCIUM 8.4* 8.3*   GFR Estimated Creatinine Clearance: 41.2 mL/min (by C-G formula based on SCr of 0.78 mg/dL). Liver Function Tests: Recent Labs  Lab 10/05/17 0518  AST 16  ALT 6*  ALKPHOS 50  BILITOT 0.5  PROT 6.0*  ALBUMIN 2.4*   No results for input(s): LIPASE, AMYLASE in the last 168 hours. No results for input(s): AMMONIA in the last 168 hours. Coagulation profile No results for input(s): INR, PROTIME in the last 168 hours.  CBC: Recent Labs  Lab 10/04/17 1101 10/05/17 0518  WBC 5.4 3.8*  NEUTROABS 3.9  --   HGB 9.6* 9.7*  HCT 30.1* 30.8*  MCV 102.0* 102.7*  PLT 213 206   Cardiac Enzymes: No results for input(s): CKTOTAL, CKMB, CKMBINDEX, TROPONINI in the last 168 hours. BNP (last 3 results) No results for input(s): PROBNP in the last 8760 hours. CBG: No results for input(s): GLUCAP in the last 168 hours. D-Dimer: No results for input(s): DDIMER in the last 72 hours. Hgb A1c: No results for input(s): HGBA1C in the last 72 hours. Lipid Profile: No results for input(s): CHOL, HDL, LDLCALC, TRIG, CHOLHDL, LDLDIRECT in the last 72 hours. Thyroid function studies: No results for input(s): TSH, T4TOTAL, T3FREE, THYROIDAB in the last 72 hours.  Invalid input(s): FREET3 Anemia work up: No results for input(s): VITAMINB12, FOLATE, FERRITIN, TIBC, IRON, RETICCTPCT in the last 72 hours. Sepsis Labs: Recent Labs  Lab 10/04/17 1101  10/04/17 1111 10/04/17 1342 10/05/17 0518  WBC 5.4  --   --  3.8*  LATICACIDVEN  --  2.14* 1.34  --    Microbiology Recent Results (from the past 240 hour(s))  Urine culture     Status: Abnormal   Collection Time: 10/04/17 11:34 AM  Result Value Ref Range Status   Specimen Description URINE, CATHETERIZED  Final   Special Requests NONE  Final   Culture (A)  Final    >=100,000 COLONIES/mL ESCHERICHIA COLI Confirmed Extended Spectrum Beta-Lactamase Producer (ESBL).  In bloodstream infections from ESBL organisms, carbapenems are preferred over piperacillin/tazobactam. They are shown to have a lower risk of mortality. Performed at [facility], [address] [place], [place] [address]    Report Status 10/06/2017 FINAL  Final   Organism ID, Bacteria ESCHERICHIA COLI (A)  Final      Susceptibility  Escherichia coli - MIC*    AMPICILLIN >=32 RESISTANT Resistant     CEFAZOLIN >=64 RESISTANT Resistant     CEFTRIAXONE RESISTANT Resistant     CIPROFLOXACIN >=4 RESISTANT Resistant     GENTAMICIN <=1 SENSITIVE Sensitive     IMIPENEM <=0.25 SENSITIVE Sensitive     NITROFURANTOIN 64 INTERMEDIATE Intermediate     TRIMETH/SULFA >=320 RESISTANT Resistant     AMPICILLIN/SULBACTAM 8 SENSITIVE Sensitive     PIP/TAZO <=4 SENSITIVE Sensitive     Extended ESBL POSITIVE Resistant     * >=100,000 COLONIES/mL ESCHERICHIA COLI     Medications:   . acetaminophen  1,000 mg Oral BID  . apixaban  5 mg Oral BID  . cholecalciferol  2,000 Units Oral Q breakfast  . citalopram  20 mg Oral QHS  . divalproex  250 mg Oral BID  . feeding supplement (ENSURE ENLIVE)  237 mL Oral TID BM  . fluconazole  100 mg Oral Daily  . fluticasone  2 spray Each Nare Daily  . Gerhardt's butt cream   Topical Q4H  . loratadine  10 mg Oral Q breakfast  . midodrine  2.5 mg Oral TID WC  . mirtazapine  7.5 mg Oral QHS  . multivitamin with minerals  1 tablet Oral Daily  . pravastatin  20 mg Oral QHS  . senna-docusate   1 tablet Oral Daily  . traZODone  100 mg Oral QHS  . vitamin B-12  1,000 mcg Oral Q breakfast   Continuous Infusions: . sodium chloride 75 mL (10/06/17 1218)     LOS: 2 days   Marinda ElkAbraham Feliz Ortiz  [facility] Pager [phone]  *Please refer to [email or web address], password [email or web address] to get updated schedule on who will round on this patient, as hospitalists switch teams weekly. If 7PM-7AM, please contact night-coverage at [email or web address], password [email or web address] for any overnight needs.  10/07/2017, 11:17 AM

## 2017-10-07 NOTE — Progress Notes (Signed)
10/07/17 0200 Nursing PA Schoor called reg request for foley due to multiple painful cleanings of perianal area due to incontinence of bladder and stool. Order recieved

## 2017-10-07 NOTE — Progress Notes (Addendum)
No return call from patient son 12/19. CSW called patient son, left voicemail this am.  Pt. Daughter in law plans to inform son to call CSW back/ to discuss d/c plan to SNF.   12/20- CSW spoke to son and provided list of bed offers.   Vivi BarrackNicole Guiliana Shor, Theresia MajorsLCSWA, MSW Clinical Social Worker  [phone] 10/07/2017  9:16 AM

## 2017-10-08 DIAGNOSIS — A498 Other bacterial infections of unspecified site: Secondary | ICD-10-CM

## 2017-10-08 DIAGNOSIS — Z1612 Extended spectrum beta lactamase (ESBL) resistance: Secondary | ICD-10-CM

## 2017-10-08 LAB — BASIC METABOLIC PANEL
Anion gap: 6 (ref 5–15)
BUN: 18 mg/dL (ref 6–20)
CALCIUM: 8.7 mg/dL — AB (ref 8.9–10.3)
CO2: 29 mmol/L (ref 22–32)
CREATININE: 0.76 mg/dL (ref 0.44–1.00)
Chloride: 104 mmol/L (ref 101–111)
GFR calc Af Amer: >60 mL/min (ref >60)
GFR calc non Af Amer: >60 mL/min (ref >60)
GLUCOSE: 83 mg/dL (ref 65–99)
POTASSIUM: 4.2 mmol/L (ref 3.5–5.1)
SODIUM: 139 mmol/L (ref 135–145)

## 2017-10-08 MED ORDER — FLUCONAZOLE 100 MG PO TABS: 100.0000 mg | ORAL_TABLET | Freq: Every day | ORAL | 0 refills | Status: DC

## 2017-10-08 NOTE — Progress Notes (Signed)
[facility]  D Susann GivensFranklin RN

## 2017-10-08 NOTE — Discharge Summary (Signed)
Physician Discharge Summary  Mikayla Boyd UJW:[id number] DOB: [birth year] DOA: 10/04/2017  PCP: Florentina Jenny, MD  Admit date: 10/04/2017 Discharge date: 10/08/2017  Admitted From: home Disposition:  SNF  Recommendations for Outpatient Follow-up:  1. Follow up with PCP in 1-2 weeks 2. Will be transfer to to [facility]   Home Health:no Equipment/Devices:none  Discharge Condition:Stable CODE STATUS:DNR Diet recommendation: Heart Healthy  Brief/Interim Summary:  83 y.o. female past medical history of chronic atrial fibrillation with a pacemaker, dementia and essential hypertension who presents to the ED with buttock pain, most of the history is obtained from chart as patient cannot contribute to her history due to her dementia.,  In the ED she was found to be afebrile vitals are stable mildly elevated lactic acid of 2.1 and a UA with evidence of a UTI.  Discharge Diagnoses:  Active Problems:   UTI (urinary tract infection)   Chronic diastolic CHF (congestive heart failure) (HCC)   Pacemaker   Atrial fibrillation, chronic (HCC)   Dementia   Acute lower UTI   Fungal infection of the groin   Pressure injury of buttock, stage 1   Infection due to ESBL-producing Escherichia coli  UTI (urinary tract infection) due to infections due to ESBL E. coli/groin fungal infection: Urine culture came bac E. coli resistant to Rocephin, rocephin was change to fosfomycin Physical therapy evaluated the patient to recommend a skilled nursing facility. Cont Nystatin powder and diflucan for 5 days as an outpatient.   Chronic diastolic CHF (congestive heart failure) (HCC)/chronic hypotension No changes made.  Pacemaker:  Atrial fibrillation, chronic (HCC) Rate controlled continue Xarelto.  Dementia without behavioral disturbances Cont depakote    Discharge Instructions  Discharge Instructions    Diet - low sodium heart healthy   Complete by:  As directed    Increase  activity slowly   Complete by:  As directed      Allergies as of 10/08/2017   No Known Allergies     Medication List    STOP taking these medications   acetaminophen 500 MG tablet Commonly known as:  TYLENOL   cetirizine 10 MG tablet Commonly known as:  ZYRTEC   LORazepam 0.5 MG tablet Commonly known as:  ATIVAN   metoCLOPramide 10 MG tablet Commonly known as:  REGLAN     TAKE these medications   citalopram 20 MG tablet Commonly known as:  CELEXA Take 20 mg by mouth at bedtime.   divalproex 125 MG capsule Commonly known as:  DEPAKOTE SPRINKLE Take 250 mg by mouth 2 (two) times daily.   ELIQUIS 5 MG Tabs tablet Generic drug:  apixaban Take 5 mg by mouth 2 (two) times daily.   ENSURE PLUS Liqd Take 237 mLs by mouth 3 (three) times daily between meals.   fluconazole 100 MG tablet Commonly known as:  DIFLUCAN Take 1 tablet (100 mg total) by mouth daily.   fluticasone 50 MCG/ACT nasal spray Commonly known as:  FLONASE Place 2 sprays into both nostrils daily.   furosemide 20 MG tablet Commonly known as:  LASIX Take 20 mg by mouth every other day.   KLS ANTI-DIARRHEAL 2 MG tablet Generic drug:  loperamide Take 2-4 mg by mouth daily as needed for diarrhea or loose stools.   loratadine 10 MG tablet Commonly known as:  CLARITIN Take 10 mg by mouth daily with breakfast.   Melatonin 3 MG Caps Take 5 mg by mouth at bedtime.   midodrine 2.5 MG tablet Commonly known as:  PROAMATINE Take 2.5 mg by mouth 3 (three) times daily with meals.   mirtazapine 7.5 MG tablet Commonly known as:  REMERON Take 7.5 mg by mouth at bedtime.   nitroGLYCERIN 0.4 MG SL tablet Commonly known as:  NITROSTAT Place 0.4 mg under the tongue every 5 (five) minutes as needed for chest pain.   nystatin powder Generic drug:  nystatin Apply 1 Bottle topically 2 (two) times daily as needed (rash). What changed:  Another medication with the same name was removed. Continue taking this  medication, and follow the directions you see here.   polyethylene glycol packet Commonly known as:  MIRALAX / GLYCOLAX Take 17 g by mouth daily as needed for mild constipation or moderate constipation.   pravastatin 20 MG tablet Commonly known as:  PRAVACHOL Take 20 mg by mouth at bedtime.   SECURA PROTECTIVE 10 % Crea Generic drug:  ZINC OXIDE (TOPICAL) Apply 1 application topically daily as needed (skin irritation).   senna-docusate 8.6-50 MG tablet Commonly known as:  Senokot-S Take 1 tablet by mouth daily.   THERA-D 2000 2000 units Tabs Generic drug:  Cholecalciferol Take 2,000 Units by mouth daily with breakfast.   traZODone 100 MG tablet Commonly known as:  DESYREL Take 100 mg by mouth at bedtime.   vitamin B-12 1000 MCG tablet Commonly known as:  CYANOCOBALAMIN Take 1,000 mcg by mouth daily with breakfast.      Contact information for after-discharge care    Destination    [facility] .   Service:  Skilled Nursing Contact information: [address] [place] [phone]             No Known Allergies  Consultations:  None   Procedures/Studies: Dg Chest Port 1 View  Result Date: 10/06/2017 CLINICAL DATA:  Wheezing. EXAM: PORTABLE CHEST 1 VIEW COMPARISON:  None. FINDINGS: Left chest wall pacer device with single lead terminating in the right ventricle. Mild cardiomegaly. Atherosclerotic calcification of the aortic arch. Normal pulmonary vascularity. No focal consolidation, pleural effusion, or pneumothorax. No acute osseous abnormality. IMPRESSION: 1. Mild cardiomegaly.  No active cardiopulmonary disease. 2.  Aortic atherosclerosis (ICD10-I70.0). Electronically Signed   By: Obie DredgeWilliam T Derry M.D.   On: 10/06/2017 15:43     Subjective: Feels great appetite has return  Discharge Exam: Vitals:   10/07/17 2019 10/08/17 0448  BP: (!) 126/37 (!) 103/40  Pulse: 65 65  Resp: 17 15  Temp: 98.2 F (36.8 C)  97.6 F (36.4 C)  SpO2: 98% 95%   Vitals:   10/07/17 0534 10/07/17 1447 10/07/17 2019 10/08/17 0448  BP: (!) 114/39 (!) 105/48 (!) 126/37 (!) 103/40  Pulse: 65 65 65 65  Resp: 14 16 17 15   Temp: 97.8 F (36.6 C) 97.8 F (36.6 C) 98.2 F (36.8 C) 97.6 F (36.4 C)  TempSrc: Oral Oral Oral Oral  SpO2: 98% 97% 98% 95%  Weight:      Height:        General: Pt is alert, awake, not in acute distress Cardiovascular: RRR, S1/S2 +, no rubs, no gallops Respiratory: CTA bilaterally, no wheezing, no rhonchi Abdominal: Soft, NT, ND, bowel sounds + Extremities: no edema, no cyanosis    The results of significant diagnostics from this hospitalization (including imaging, microbiology, ancillary and laboratory) are listed below for reference.     Microbiology: Recent Results (from the past 240 hour(s))  Urine culture     Status: Abnormal   Collection Time: 10/04/17 11:34 AM  Result Value Ref Range Status   Specimen Description URINE, CATHETERIZED  Final   Special Requests NONE  Final   Culture (A)  Final    >=100,000 COLONIES/mL ESCHERICHIA COLI Confirmed Extended Spectrum Beta-Lactamase Producer (ESBL).  In bloodstream infections from ESBL organisms, carbapenems are preferred over piperacillin/tazobactam. They are shown to have a lower risk of mortality. Performed at [facility], [address] [place], [place] [address]    Report Status 10/06/2017 FINAL  Final   Organism ID, Bacteria ESCHERICHIA COLI (A)  Final      Susceptibility   Escherichia coli - MIC*    AMPICILLIN >=32 RESISTANT Resistant     CEFAZOLIN >=64 RESISTANT Resistant     CEFTRIAXONE RESISTANT Resistant     CIPROFLOXACIN >=4 RESISTANT Resistant     GENTAMICIN <=1 SENSITIVE Sensitive     IMIPENEM <=0.25 SENSITIVE Sensitive     NITROFURANTOIN 64 INTERMEDIATE Intermediate     TRIMETH/SULFA >=320 RESISTANT Resistant     AMPICILLIN/SULBACTAM 8 SENSITIVE Sensitive     PIP/TAZO <=4 SENSITIVE Sensitive      Extended ESBL POSITIVE Resistant     * >=100,000 COLONIES/mL ESCHERICHIA COLI     Labs: BNP (last 3 results) No results for input(s): BNP in the last 8760 hours. Basic Metabolic Panel: Recent Labs  Lab 10/04/17 1101 10/05/17 0518 10/08/17 0556  NA 138 139 139  K 4.3 3.9 4.2  CL 106 109 104  CO2 28 23 29   GLUCOSE 118* 102* 83  BUN 24* 19 18  CREATININE 1.00 0.78 0.76  CALCIUM 8.4* 8.3* 8.7*   Liver Function Tests: Recent Labs  Lab 10/05/17 0518  AST 16  ALT 6*  ALKPHOS 50  BILITOT 0.5  PROT 6.0*  ALBUMIN 2.4*   No results for input(s): LIPASE, AMYLASE in the last 168 hours. No results for input(s): AMMONIA in the last 168 hours. CBC: Recent Labs  Lab 10/04/17 1101 10/05/17 0518  WBC 5.4 3.8*  NEUTROABS 3.9  --   HGB 9.6* 9.7*  HCT 30.1* 30.8*  MCV 102.0* 102.7*  PLT 213 206   Cardiac Enzymes: No results for input(s): CKTOTAL, CKMB, CKMBINDEX, TROPONINI in the last 168 hours. BNP: Invalid input(s): POCBNP CBG: No results for input(s): GLUCAP in the last 168 hours. D-Dimer No results for input(s): DDIMER in the last 72 hours. Hgb A1c No results for input(s): HGBA1C in the last 72 hours. Lipid Profile No results for input(s): CHOL, HDL, LDLCALC, TRIG, CHOLHDL, LDLDIRECT in the last 72 hours. Thyroid function studies No results for input(s): TSH, T4TOTAL, T3FREE, THYROIDAB in the last 72 hours.  Invalid input(s): FREET3 Anemia work up No results for input(s): VITAMINB12, FOLATE, FERRITIN, TIBC, IRON, RETICCTPCT in the last 72 hours. Urinalysis    Component Value Date/Time   COLORURINE AMBER (A) 10/04/2017 1134   APPEARANCEUR CLOUDY (A) 10/04/2017 1134   LABSPEC 1.016 10/04/2017 1134   PHURINE 5.0 10/04/2017 1134   GLUCOSEU NEGATIVE 10/04/2017 1134   HGBUR SMALL (A) 10/04/2017 1134   BILIRUBINUR NEGATIVE 10/04/2017 1134   KETONESUR NEGATIVE 10/04/2017 1134   PROTEINUR 100 (A) 10/04/2017 1134   NITRITE NEGATIVE 10/04/2017 1134   LEUKOCYTESUR  LARGE (A) 10/04/2017 1134   Sepsis Labs Invalid input(s): PROCALCITONIN,  WBC,  LACTICIDVEN Microbiology Recent Results (from the past 240 hour(s))  Urine culture     Status: Abnormal   Collection Time: 10/04/17 11:34 AM  Result Value Ref Range Status   Specimen Description URINE, CATHETERIZED  Final  Special Requests NONE  Final   Culture (A)  Final    >=100,000 COLONIES/mL ESCHERICHIA COLI Confirmed Extended Spectrum Beta-Lactamase Producer (ESBL).  In bloodstream infections from ESBL organisms, carbapenems are preferred over piperacillin/tazobactam. They are shown to have a lower risk of mortality. Performed at [facility], [address], [place], [place] [address]    Report Status 10/06/2017 FINAL  Final   Organism ID, Bacteria ESCHERICHIA COLI (A)  Final      Susceptibility   Escherichia coli - MIC*    AMPICILLIN >=32 RESISTANT Resistant     CEFAZOLIN >=64 RESISTANT Resistant     CEFTRIAXONE RESISTANT Resistant     CIPROFLOXACIN >=4 RESISTANT Resistant     GENTAMICIN <=1 SENSITIVE Sensitive     IMIPENEM <=0.25 SENSITIVE Sensitive     NITROFURANTOIN 64 INTERMEDIATE Intermediate     TRIMETH/SULFA >=320 RESISTANT Resistant     AMPICILLIN/SULBACTAM 8 SENSITIVE Sensitive     PIP/TAZO <=4 SENSITIVE Sensitive     Extended ESBL POSITIVE Resistant     * >=100,000 COLONIES/mL ESCHERICHIA COLI     Time coordinating discharge: Over 30 minutes  SIGNED:   Marinda Elk, MD  [facility] 10/08/2017, 9:34 AM Pager   If 7PM-7AM, please contact night-coverage [email or web address] Password [email or web address]

## 2017-10-08 NOTE — Clinical Social Work Placement (Addendum)
Patient son-Mark inform [facility] today [facility] called for transport.  Nurse given number for report.  D/C summary sent.  CLINICAL SOCIAL WORK PLACEMENT  NOTE  Date:  10/08/2017  Patient Details  Name: Mikayla BumpGwendolyn Mayer MRN: [id number] Date of Birth: Jan 30, 1924  Clinical Social Work is seeking post-discharge placement for this patient at the Skilled  Nursing Facility level of care (*CSW will initial, date and re-position this form in  chart as items are completed):  Yes   Patient/family provided with [place] Clinical Social Work Department's list of facilities offering this level of care within the geographic area requested by the patient (or if unable, by the patient's family).  Yes   Patient/family informed of their freedom to choose among providers that offer the needed level of care, that participate in Medicare, Medicaid or managed care program needed by the patient, have an available bed and are willing to accept the patient.  Yes   Patient/family informed of 's ownership interest in [facility] and [facility], as well as of the fact that they are under no obligation to receive care at these facilities.  PASRR submitted to EDS on 10/06/17     PASRR number received on 10/07/17     Existing PASRR number confirmed on       FL2 transmitted to all facilities in geographic area requested by pt/family on       FL2 transmitted to all facilities within larger geographic area on 10/08/17     Patient informed that his/her managed care company has contracts with or will negotiate with certain facilities, including the following:  [facility]     Yes   Patient/family informed of bed offers received.  [facility]     Physician recommends and patient chooses bed at      [facility].  Patient to be transferred to facility by [facility]     Patient family notified on  10/08/17 of transfer.  Name of family member notified:  Son(Mark), Daughter in law   PHYSICIAN       Additional Comment:    _______________________________________________ Clearance CootsNicole A Foch Rosenwald, LCSW 10/08/2017, 10:32 AM

## 2017-11-13 ENCOUNTER — Encounter (HOSPITAL_COMMUNITY): Payer: Self-pay | Admitting: *Deleted

## 2017-11-13 ENCOUNTER — Emergency Department (HOSPITAL_COMMUNITY): Payer: Medicare Other

## 2017-11-13 ENCOUNTER — Other Ambulatory Visit: Payer: Self-pay

## 2017-11-13 ENCOUNTER — Inpatient Hospital Stay (HOSPITAL_COMMUNITY): Payer: Medicare Other

## 2017-11-13 ENCOUNTER — Inpatient Hospital Stay (HOSPITAL_COMMUNITY)
Admission: EM | Admit: 2017-11-13 | Discharge: 2017-11-19 | DRG: 208 | Disposition: E | Payer: Medicare Other | Attending: Pulmonary Disease | Admitting: Pulmonary Disease

## 2017-11-13 DIAGNOSIS — J189 Pneumonia, unspecified organism: Principal | ICD-10-CM | POA: Diagnosis present

## 2017-11-13 DIAGNOSIS — E785 Hyperlipidemia, unspecified: Secondary | ICD-10-CM | POA: Diagnosis present

## 2017-11-13 DIAGNOSIS — F0390 Unspecified dementia without behavioral disturbance: Secondary | ICD-10-CM | POA: Diagnosis present

## 2017-11-13 DIAGNOSIS — F329 Major depressive disorder, single episode, unspecified: Secondary | ICD-10-CM | POA: Diagnosis present

## 2017-11-13 DIAGNOSIS — J440 Chronic obstructive pulmonary disease with acute lower respiratory infection: Secondary | ICD-10-CM | POA: Diagnosis present

## 2017-11-13 DIAGNOSIS — J9601 Acute respiratory failure with hypoxia: Secondary | ICD-10-CM | POA: Diagnosis present

## 2017-11-13 DIAGNOSIS — I5032 Chronic diastolic (congestive) heart failure: Secondary | ICD-10-CM | POA: Diagnosis present

## 2017-11-13 DIAGNOSIS — I110 Hypertensive heart disease with heart failure: Secondary | ICD-10-CM | POA: Diagnosis present

## 2017-11-13 DIAGNOSIS — I482 Chronic atrial fibrillation: Secondary | ICD-10-CM | POA: Diagnosis present

## 2017-11-13 DIAGNOSIS — Z7901 Long term (current) use of anticoagulants: Secondary | ICD-10-CM | POA: Diagnosis not present

## 2017-11-13 DIAGNOSIS — Z79899 Other long term (current) drug therapy: Secondary | ICD-10-CM

## 2017-11-13 DIAGNOSIS — G2581 Restless legs syndrome: Secondary | ICD-10-CM | POA: Diagnosis present

## 2017-11-13 DIAGNOSIS — Z515 Encounter for palliative care: Secondary | ICD-10-CM | POA: Diagnosis not present

## 2017-11-13 DIAGNOSIS — J449 Chronic obstructive pulmonary disease, unspecified: Secondary | ICD-10-CM | POA: Diagnosis present

## 2017-11-13 DIAGNOSIS — I2510 Atherosclerotic heart disease of native coronary artery without angina pectoris: Secondary | ICD-10-CM | POA: Diagnosis present

## 2017-11-13 DIAGNOSIS — Z7189 Other specified counseling: Secondary | ICD-10-CM | POA: Diagnosis not present

## 2017-11-13 DIAGNOSIS — Z7951 Long term (current) use of inhaled steroids: Secondary | ICD-10-CM | POA: Diagnosis not present

## 2017-11-13 DIAGNOSIS — Z66 Do not resuscitate: Secondary | ICD-10-CM | POA: Diagnosis present

## 2017-11-13 DIAGNOSIS — J441 Chronic obstructive pulmonary disease with (acute) exacerbation: Secondary | ICD-10-CM

## 2017-11-13 DIAGNOSIS — Z950 Presence of cardiac pacemaker: Secondary | ICD-10-CM

## 2017-11-13 DIAGNOSIS — J9600 Acute respiratory failure, unspecified whether with hypoxia or hypercapnia: Secondary | ICD-10-CM

## 2017-11-13 DIAGNOSIS — J9621 Acute and chronic respiratory failure with hypoxia: Secondary | ICD-10-CM | POA: Diagnosis not present

## 2017-11-13 DIAGNOSIS — Y95 Nosocomial condition: Secondary | ICD-10-CM | POA: Diagnosis present

## 2017-11-13 DIAGNOSIS — G9349 Other encephalopathy: Secondary | ICD-10-CM | POA: Diagnosis present

## 2017-11-13 DIAGNOSIS — Z79891 Long term (current) use of opiate analgesic: Secondary | ICD-10-CM | POA: Diagnosis not present

## 2017-11-13 LAB — URINALYSIS, ROUTINE W REFLEX MICROSCOPIC
Bilirubin Urine: NEGATIVE
Glucose, UA: 50 mg/dL — AB
KETONES UR: NEGATIVE mg/dL
LEUKOCYTES UA: NEGATIVE
NITRITE: NEGATIVE
PH: 5 (ref 5.0–8.0)
PROTEIN: 100 mg/dL — AB
Specific Gravity, Urine: 1.014 (ref 1.005–1.030)

## 2017-11-13 LAB — CBC WITH DIFFERENTIAL/PLATELET
BASOS PCT: 0 %
Basophils Absolute: 0 10*3/uL (ref 0.0–0.1)
EOS PCT: 0 %
Eosinophils Absolute: 0 10*3/uL (ref 0.0–0.7)
HEMATOCRIT: 29.9 % — AB (ref 36.0–46.0)
HEMOGLOBIN: 9.1 g/dL — AB (ref 12.0–15.0)
LYMPHS PCT: 10 %
Lymphs Abs: 1 10*3/uL (ref 0.7–4.0)
MCH: 31.6 pg (ref 26.0–34.0)
MCHC: 30.4 g/dL (ref 30.0–36.0)
MCV: 103.8 fL — AB (ref 78.0–100.0)
MONOS PCT: 16 %
Monocytes Absolute: 1.5 10*3/uL — ABNORMAL HIGH (ref 0.1–1.0)
NEUTROS PCT: 74 %
Neutro Abs: 7 10*3/uL (ref 1.7–7.7)
Platelets: 219 10*3/uL (ref 150–400)
RBC: 2.88 MIL/uL — AB (ref 3.87–5.11)
RDW: 13.9 % (ref 11.5–15.5)
WBC: 9.5 10*3/uL (ref 4.0–10.5)

## 2017-11-13 LAB — RESPIRATORY PANEL BY PCR
Adenovirus: NOT DETECTED
BORDETELLA PERTUSSIS-RVPCR: NOT DETECTED
CHLAMYDOPHILA PNEUMONIAE-RVPPCR: NOT DETECTED
CORONAVIRUS HKU1-RVPPCR: NOT DETECTED
Coronavirus 229E: NOT DETECTED
Coronavirus NL63: NOT DETECTED
Coronavirus OC43: NOT DETECTED
Influenza A: NOT DETECTED
Influenza B: NOT DETECTED
METAPNEUMOVIRUS-RVPPCR: NOT DETECTED
Mycoplasma pneumoniae: NOT DETECTED
PARAINFLUENZA VIRUS 2-RVPPCR: NOT DETECTED
Parainfluenza Virus 1: NOT DETECTED
Parainfluenza Virus 3: NOT DETECTED
Parainfluenza Virus 4: NOT DETECTED
Respiratory Syncytial Virus: NOT DETECTED
Rhinovirus / Enterovirus: NOT DETECTED

## 2017-11-13 LAB — CBG MONITORING, ED
GLUCOSE-CAPILLARY: 155 mg/dL — AB (ref 65–99)
Glucose-Capillary: 147 mg/dL — ABNORMAL HIGH (ref 65–99)
Glucose-Capillary: 154 mg/dL — ABNORMAL HIGH (ref 65–99)

## 2017-11-13 LAB — LACTIC ACID, PLASMA
LACTIC ACID, VENOUS: 3.4 mmol/L — AB (ref 0.5–1.9)
LACTIC ACID, VENOUS: 5.2 mmol/L — AB (ref 0.5–1.9)

## 2017-11-13 LAB — VALPROIC ACID LEVEL: VALPROIC ACID LVL: 22 ug/mL — AB (ref 50.0–100.0)

## 2017-11-13 LAB — MRSA PCR SCREENING: MRSA by PCR: POSITIVE — AB

## 2017-11-13 LAB — COMPREHENSIVE METABOLIC PANEL
ALT: 12 U/L — AB (ref 14–54)
ANION GAP: 11 (ref 5–15)
AST: 23 U/L (ref 15–41)
Albumin: 2.6 g/dL — ABNORMAL LOW (ref 3.5–5.0)
Alkaline Phosphatase: 40 U/L (ref 38–126)
BUN: 26 mg/dL — ABNORMAL HIGH (ref 6–20)
CHLORIDE: 99 mmol/L — AB (ref 101–111)
CO2: 26 mmol/L (ref 22–32)
CREATININE: 1.44 mg/dL — AB (ref 0.44–1.00)
Calcium: 8.4 mg/dL — ABNORMAL LOW (ref 8.9–10.3)
GFR, EST AFRICAN AMERICAN: 36 mL/min — AB (ref >60)
GFR, EST NON AFRICAN AMERICAN: 31 mL/min — AB (ref >60)
Glucose, Bld: 148 mg/dL — ABNORMAL HIGH (ref 65–99)
Potassium: 4.7 mmol/L (ref 3.5–5.1)
SODIUM: 136 mmol/L (ref 135–145)
Total Bilirubin: 0.7 mg/dL (ref 0.3–1.2)
Total Protein: 7 g/dL (ref 6.5–8.1)

## 2017-11-13 LAB — I-STAT ARTERIAL BLOOD GAS, ED
Acid-Base Excess: 5 mmol/L — ABNORMAL HIGH (ref 0.0–2.0)
Acid-Base Excess: 1 mmol/L (ref 0.0–2.0)
BICARBONATE: 32.7 mmol/L — AB (ref 20.0–28.0)
BICARBONATE: 28.5 mmol/L — AB (ref 20.0–28.0)
O2 Saturation: 100 %
O2 Saturation: 91 %
PCO2 ART: 71 mmHg — AB (ref 32.0–48.0)
PO2 ART: 426 mmHg — AB (ref 83.0–108.0)
TCO2: 35 mmol/L — ABNORMAL HIGH (ref 22–32)
TCO2: 30 mmol/L (ref 22–32)
pCO2 arterial: 53.9 mmHg — ABNORMAL HIGH (ref 32.0–48.0)
pH, Arterial: 7.275 — ABNORMAL LOW (ref 7.350–7.450)
pH, Arterial: 7.326 — ABNORMAL LOW (ref 7.350–7.450)
pO2, Arterial: 64 mmHg — ABNORMAL LOW (ref 83.0–108.0)

## 2017-11-13 LAB — GLUCOSE, CAPILLARY: GLUCOSE-CAPILLARY: 165 mg/dL — AB (ref 65–99)

## 2017-11-13 LAB — PROTIME-INR
INR: 1.32
Prothrombin Time: 16.3 seconds — ABNORMAL HIGH (ref 11.4–15.2)

## 2017-11-13 LAB — STREP PNEUMONIAE URINARY ANTIGEN: Strep Pneumo Urinary Antigen: NEGATIVE

## 2017-11-13 LAB — HEMOGLOBIN A1C
HEMOGLOBIN A1C: 4.7 % — AB (ref 4.8–5.6)
MEAN PLASMA GLUCOSE: 88.19 mg/dL

## 2017-11-13 LAB — PROCALCITONIN: Procalcitonin: 3.99 ng/mL

## 2017-11-13 LAB — TROPONIN I: Troponin I: 0.07 ng/mL (ref <0.03)

## 2017-11-13 LAB — TRIGLYCERIDES: Triglycerides: 37 mg/dL (ref <150)

## 2017-11-13 LAB — AMMONIA: AMMONIA: 27 umol/L (ref 9–35)

## 2017-11-13 MED ORDER — ACETAMINOPHEN 160 MG/5ML PO SOLN
650.0000 mg | Freq: Four times a day (QID) | ORAL | Status: DC | PRN
  Administered 2017-11-14: 650 mg
  Filled 2017-11-13: qty 20.3

## 2017-11-13 MED ORDER — SODIUM CHLORIDE 0.9 % IV SOLN: 250.0000 mL | INTRAVENOUS | Status: DC | PRN

## 2017-11-13 MED ORDER — MIDAZOLAM HCL 2 MG/2ML IJ SOLN
1.0000 mg | INTRAMUSCULAR | Status: DC | PRN
  Administered 2017-11-13: 2 mg via INTRAVENOUS

## 2017-11-13 MED ORDER — DEXTROSE 5 % IV SOLN
500.0000 mg | INTRAVENOUS | Status: DC
  Administered 2017-11-13: 500 mg via INTRAVENOUS
  Filled 2017-11-13 (×2): qty 500

## 2017-11-13 MED ORDER — MIDAZOLAM HCL 2 MG/2ML IJ SOLN
INTRAMUSCULAR | Status: AC
  Filled 2017-11-13: qty 2

## 2017-11-13 MED ORDER — INSULIN ASPART 100 UNIT/ML ~~LOC~~ SOLN: 0.0000 [IU] | Freq: Three times a day (TID) | SUBCUTANEOUS | Status: DC

## 2017-11-13 MED ORDER — FENTANYL CITRATE (PF) 100 MCG/2ML IJ SOLN
50.0000 ug | INTRAMUSCULAR | Status: DC | PRN
  Administered 2017-11-13: 50 ug via INTRAVENOUS

## 2017-11-13 MED ORDER — IPRATROPIUM-ALBUTEROL 0.5-2.5 (3) MG/3ML IN SOLN
3.0000 mL | Freq: Four times a day (QID) | RESPIRATORY_TRACT | Status: DC
Start: 2017-11-13 — End: 2017-11-14
  Administered 2017-11-13 (×2): 3 mL via RESPIRATORY_TRACT
  Filled 2017-11-13 (×2): qty 3

## 2017-11-13 MED ORDER — PIPERACILLIN-TAZOBACTAM 3.375 G IVPB
3.3750 g | Freq: Three times a day (TID) | INTRAVENOUS | Status: DC
  Administered 2017-11-13 – 2017-11-14 (×3): 3.375 g via INTRAVENOUS
  Filled 2017-11-13 (×4): qty 50

## 2017-11-13 MED ORDER — PROPOFOL 1000 MG/100ML IV EMUL
INTRAVENOUS | Status: AC
  Filled 2017-11-13: qty 100

## 2017-11-13 MED ORDER — METHYLPREDNISOLONE SODIUM SUCC 125 MG IJ SOLR
60.0000 mg | Freq: Four times a day (QID) | INTRAMUSCULAR | Status: DC
  Administered 2017-11-13 – 2017-11-15 (×8): 60 mg via INTRAVENOUS
  Filled 2017-11-13: qty 2
  Filled 2017-11-13: qty 0.96
  Filled 2017-11-13: qty 2
  Filled 2017-11-13: qty 0.96
  Filled 2017-11-13: qty 2
  Filled 2017-11-13 (×5): qty 0.96

## 2017-11-13 MED ORDER — PROPOFOL 1000 MG/100ML IV EMUL
0.0000 ug/kg/min | INTRAVENOUS | Status: DC
  Administered 2017-11-13: 5 ug/kg/min via INTRAVENOUS

## 2017-11-13 MED ORDER — DEXTROSE IN LACTATED RINGERS 5 % IV SOLN
INTRAVENOUS | Status: DC
  Administered 2017-11-13: 15:00:00 via INTRAVENOUS

## 2017-11-13 MED ORDER — FENTANYL CITRATE (PF) 100 MCG/2ML IJ SOLN
INTRAMUSCULAR | Status: AC
  Filled 2017-11-13: qty 2

## 2017-11-13 MED ORDER — ALBUTEROL SULFATE (2.5 MG/3ML) 0.083% IN NEBU: 2.5000 mg | INHALATION_SOLUTION | RESPIRATORY_TRACT | Status: DC | PRN

## 2017-11-13 MED ORDER — PROPOFOL 1000 MG/100ML IV EMUL
0.0000 ug/kg/min | INTRAVENOUS | Status: DC
  Administered 2017-11-13: 10 ug/kg/min via INTRAVENOUS
  Administered 2017-11-13 – 2017-11-14 (×2): 20 ug/kg/min via INTRAVENOUS
  Filled 2017-11-13 (×2): qty 100

## 2017-11-13 MED ORDER — FENTANYL CITRATE (PF) 100 MCG/2ML IJ SOLN: 50.0000 ug | INTRAMUSCULAR | Status: DC | PRN

## 2017-11-13 MED ORDER — MIDAZOLAM HCL 2 MG/2ML IJ SOLN
2.0000 mg | Freq: Once | INTRAMUSCULAR | Status: AC
  Administered 2017-11-13: 2 mg via INTRAVENOUS

## 2017-11-13 MED ORDER — CHLORHEXIDINE GLUCONATE 0.12% ORAL RINSE (MEDLINE KIT)
15.0000 mL | Freq: Two times a day (BID) | OROMUCOSAL | Status: DC
  Administered 2017-11-13 – 2017-11-15 (×3): 15 mL via OROMUCOSAL

## 2017-11-13 MED ORDER — MIDAZOLAM HCL 2 MG/2ML IJ SOLN: 1.0000 mg | INTRAMUSCULAR | Status: DC | PRN

## 2017-11-13 MED ORDER — ORAL CARE MOUTH RINSE
15.0000 mL | Freq: Four times a day (QID) | OROMUCOSAL | Status: DC
  Administered 2017-11-13 – 2017-11-15 (×6): 15 mL via OROMUCOSAL

## 2017-11-13 MED ORDER — PANTOPRAZOLE SODIUM 40 MG IV SOLR
40.0000 mg | Freq: Every day | INTRAVENOUS | Status: DC
  Administered 2017-11-13 – 2017-11-14 (×2): 40 mg via INTRAVENOUS
  Filled 2017-11-13 (×2): qty 40

## 2017-11-13 NOTE — ED Notes (Signed)
Called report to 61M, calling md to report high temp and request further orders

## 2017-11-13 NOTE — ED Notes (Signed)
Critical care at bedside  

## 2017-11-13 NOTE — ED Notes (Signed)
Called critical care for orders, states he will be putting them in shortly.

## 2017-11-13 NOTE — ED Notes (Signed)
Attempted report, RN will call back shortly.

## 2017-11-13 NOTE — ED Notes (Signed)
Notified dr gray of +lactic and temp

## 2017-11-13 NOTE — Progress Notes (Signed)
Pt transported to CT then 57M ICU via vent 100% fio2 w/ no apparent complications.

## 2017-11-13 NOTE — H&P (Signed)
PULMONARY / CRITICAL CARE MEDICINE   Name: Mikayla Boyd MRN: [id number] DOB: Sep 29, 1924    ADMISSION DATE:  11/10/2017   CHIEF COMPLAINT: Unresponsive  HISTORY OF PRESENT ILLNESS:        I have essentially no history on this 84 year old nursing home resident with dementia.  I have called her son and he knows nothing of the events leading up to her admission.  I have no history from the care center other than a medication list.  She was brought to our department of emergency medicine unresponsive requiring intubation for airway protection.  Beyond that I have no history.  PAST MEDICAL HISTORY :  She  has a past medical history of A-fib (HCC), CHF (congestive heart failure) (HCC), CKD (chronic kidney disease), COPD (chronic obstructive pulmonary disease) (HCC), Coronary artery disease, Dementia, Depression, Dyslipidemia, Hypertension, Pacemaker, RLS (restless legs syndrome), Valvular heart disease, and Vertigo.  PAST SURGICAL HISTORY: She  has a past surgical history that includes Pacemaker insertion.  No Known Allergies  No current facility-administered medications on file prior to encounter.    Current Outpatient Medications on File Prior to Encounter  Medication Sig  . acetaminophen (TYLENOL) 325 MG tablet Take 650 mg by mouth every 6 (six) hours as needed for moderate pain.  Marland Kitchen acetaminophen (TYLENOL) 650 MG suppository Place 650 mg rectally every 4 (four) hours as needed for moderate pain.  Marland Kitchen apixaban (ELIQUIS) 5 MG TABS tablet Take 5 mg by mouth 2 (two) times daily.  Marland Kitchen atropine 1 % ophthalmic solution Place 1 drop under the tongue every 2 (two) hours as needed (increased secretions).  . Cholecalciferol (THERA-D 2000) 2000 units TABS Take 2,000 Units by mouth daily with breakfast.  . citalopram (CELEXA) 20 MG tablet Take 20 mg by mouth daily.   . divalproex (DEPAKOTE) 250 MG DR tablet Take 250 mg by mouth 2 (two) times daily.  Marland Kitchen ENSURE PLUS (ENSURE PLUS) LIQD Take 237 mLs by  mouth 3 (three) times daily between meals.  . fluconazole (DIFLUCAN) 150 MG tablet Take 150 mg by mouth every 3 (three) days as needed (for yeast for 2 administrations).  . fluticasone (FLONASE) 50 MCG/ACT nasal spray Place 2 sprays into both nostrils daily.  . furosemide (LASIX) 20 MG tablet Take 20 mg by mouth every other day.   . Infant Care Products (DERMACLOUD) CREA Apply 1 application topically as needed (to sacrum).  Marland Kitchen loperamide (KLS ANTI-DIARRHEAL) 2 MG tablet Take 2 mg by mouth daily as needed for diarrhea or loose stools.   Marland Kitchen loratadine (CLARITIN) 10 MG tablet Take 10 mg by mouth daily with breakfast.  . Melatonin 3 MG CAPS Take 3 mg by mouth at bedtime.   . midodrine (PROAMATINE) 2.5 MG tablet Take 2.5 mg by mouth 3 (three) times daily with meals.  . nitroGLYCERIN (NITROSTAT) 0.4 MG SL tablet Place 0.4 mg under the tongue every 5 (five) minutes as needed for chest pain.  Marland Kitchen nystatin (NYSTATIN) powder Apply 1 Bottle topically 2 (two) times daily as needed (for seven days).   . polyethylene glycol (MIRALAX / GLYCOLAX) packet Take 17 g by mouth daily as needed for mild constipation or moderate constipation.   . pravastatin (PRAVACHOL) 20 MG tablet Take 20 mg by mouth at bedtime.  . senna-docusate (SENOKOT-S) 8.6-50 MG tablet Take 1 tablet by mouth daily.  . traZODone (DESYREL) 100 MG tablet Take 100 mg by mouth at bedtime.  . vitamin B-12 (CYANOCOBALAMIN) 1000 MCG tablet Take 1,000 mcg by mouth daily  with breakfast.     FAMILY HISTORY:  Her has no family status information on file.    SOCIAL HISTORY: She  reports that  has never smoked. she has never used smokeless tobacco. She reports that she does not drink alcohol or use drugs.  REVIEW OF SYSTEMS:   Of attempted a 10 system review of systems with her son but he knows little of her medical history.  She does have significant dementia she recognizes family at baseline but he says she remembers nothing.  He also has a history of  COPD but is not oxygen dependent.  He is not aware of why she takes an anticoagulant chronically nor does he know why she takes Depakote.  There is no known history of epilepsy according to the son.  SUBJECTIVE:  Unobtainable  VITAL SIGNS: BP 99/74   Pulse 65   Temp 98.4 F (36.9 C)   Resp 20   Ht 5\' 8"  (1.727 m)   Wt 149 lb 14.6 oz (68 kg) Comment: per previous admission  SpO2 99%   BMI 22.79 kg/m   HEMODYNAMICS:    VENTILATOR SETTINGS: Vent Mode: PRVC FiO2 (%):  [50 %-100 %] 50 % Set Rate:  [15 bmp-20 bmp] 20 bmp Vt Set:  [510 mL] 510 mL PEEP:  [5 cmH20] 5 cmH20 Plateau Pressure:  [19 cmH20-21 cmH20] 21 cmH20  INTAKE / OUTPUT: No intake/output data recorded.  PHYSICAL EXAMINATION: General: Elderly white female who is intubated and minimally responsive.  She is in no overt distress. Neuro: He does not open eyes to voice she moves both upper extremities purposefully in response to sternal rub.  Pupils are equal and reactive the face is symmetric. HEENT: There are no bruises cuts or abrasions to suggest head trauma. Cardiovascular: S1 and S2 are regular without murmur rub or gallop. Lungs: Orally intubated and not breathing above the set ventilator rate.  There is symmetric air movement lots of scattered rhonchi and wheezes she is fairly poor air movement throughout. Abdomen: The abdomen is soft and flat without any organomegaly masses tenderness guarding or rebound.   LABS:  BMET Recent Labs  Lab 2017-11-22 1134  NA 136  K 4.7  CL 99*  CO2 26  BUN 26*  CREATININE 1.44*  GLUCOSE 148*    Electrolytes Recent Labs  Lab 2017-11-22 1134  CALCIUM 8.4*    CBC Recent Labs  Lab 2017-11-22 1134  WBC 9.5  HGB 9.1*  HCT 29.9*  PLT 219    Coag's Recent Labs  Lab 2017-11-22 1130  INR 1.32    Sepsis Markers No results for input(s): LATICACIDVEN, PROCALCITON, O2SATVEN in the last 168 hours.  ABG Recent Labs  Lab 2017-11-22 1222 2017-11-22 1337  PHART 7.275*  7.326*  PCO2ART 71.0* 53.9*  PO2ART 426.0* 64.0*    Liver Enzymes Recent Labs  Lab 2017-11-22 1134  AST 23  ALT 12*  ALKPHOS 40  BILITOT 0.7  ALBUMIN 2.6*    Cardiac Enzymes Recent Labs  Lab 2017-11-22 1134  TROPONINI 0.07*    Glucose Recent Labs  Lab 2017-11-22 1100  GLUCAP 147*    Imaging Dg Chest Portable 1 View  Result Date: 03/09/2018 CLINICAL DATA:  Respiratory failure and intubation. EXAM: PORTABLE CHEST 1 VIEW COMPARISON:  10/06/2017 FINDINGS: Endotracheal tube present with the tip approximately 3 cm above the carina. Stable appearance of single chamber pacemaker. Lungs show stable chronic disease. Additional opacities in both lower lungs may represent atelectasis versus pneumonia. No overt pulmonary edema,  significant pleural effusions or evidence of pneumothorax. The heart size is stable. IMPRESSION: 1. Endotracheal tube tip approximately 3 cm above the carina. 2. Airspace opacities in both lower lung zones consistent with atelectasis versus pneumonia. Electronically Signed   By: Irish Lack M.D.   On: 11/16/2017 11:41     STUDIES:  Chest x-ray to my eye shows a well-placed endotracheal tube there may be some subtle basilar infiltrates.  CULTURES: Budde and sputum cultures were submitted on 1/26  ANTIBIOTICS: Azithromycin and Zosyn started on 1/26    DISCUSSION:      This is a 84 year old nursing home resident who apparently is a nursing home due to dementia.  We know essentially nothing else of her past medical history other that she is known to have COPD.  She brought to our department of emergency medicine for altered mental status and was hypercarbic even after intubation.  A CT scan of the head is still pending, she is chronically on anticoagulant.  ASSESSMENT / PLAN:  PULMONARY A: He has fairly poor air movement and overt bronchospasm on exam.  She has been placed on systemic steroids and inhaled bronchodilators.  She may have a basilar infiltrate  on chest x-ray and there is a little bit of purulent material in the endotracheal tube.  It is not clear to me whether this represents a primary pathology or aspiration following a CNS event.  In either event she has been covered for atypicals and aspiration with a combination of azithromycin and Zosyn.  A PCR for influenza is pending  CARDIOVASCULAR A: He has a pacemaker in place and is anticoagulated for unknown reasons.  We know nothing else of her past cardiac history   GASTROINTESTINAL A: Prophylaxis is with Pepcid    HEMATOLOGIC A: She is chronically anticoagulated with Eliquis.  Will make a decision regarding continuation of anticoagulation pending the head CT.  INFECTIOUS A:   Patient is covered for aspiration pneumonia as well as a community-acquired process with a combination of azithromycin and Zosyn.  PCR for influenza is pending.   ENDOCRINE A: She has been placed on sliding scale insulin in the event that she develops glucose intolerance while on systemic steroids.  NEUROLOGIC A: Neurologic status is depressed but she is nonfocal on examination.  Is we have essentially no history leading up to her presentation, and she is chronically on Eliquis I think CT scan of the head is imperative.  It has been ordered and we are waiting for the study.    FAMILY  I spoke to the family specifically her son Viviana Simpler at [phone] to obtain what limited history I have.  He confirms to me that we are not to do CPR.  Greater than 32 minutes was spent in the care of this patient today   Penny Pia, MD Pulmonary and Critical Care Medicine [facility] Pager: [phone]  10/24/2017, 2:26 PM

## 2017-11-13 NOTE — ED Notes (Signed)
Spoke with dr gray, notified of rising temp and vitals, no further orders.

## 2017-11-13 NOTE — ED Notes (Signed)
Notified dr pickering of critical lab value Troponin 0.07

## 2017-11-13 NOTE — Progress Notes (Signed)
PCCM Interval Note  Prolonged family conversation with Son, Daughter, and Daughter-in-law. Patient was a long time home health nurse, however recently moved to [place] due to declining health. Has advanced dementia [facility]. Recently has been refusing all care. Family wants to respect patient's wishes to remain a DNR (from previous documentation and discussions when patient was in right states of mind). However, now that the patient is intubated, will keep intubated overnight, remain a DNR with no escalation of care. Only antibiotics, fluid, and lab draw as needed, in the morning family wants to further discuss one way extubation as patient was addiment in all previous discussions about not being on the ventilator.   Mikayla KussmaulKatalina Boyd, AGACNP-BC [facility]  Pgr: [phone]  PCCM Pgr: [id number]

## 2017-11-13 NOTE — ED Provider Notes (Signed)
[facility] EMERGENCY DEPARTMENT Provider Note   CSN: [id number] Arrival date & time: 12/12/17  1052     History   Chief Complaint Chief Complaint  Patient presents with  . Respiratory Arrest    HPI Mikayla Boyd is a 84 y.o. female. Level 5 caveat due to intubation. HPI Patient brought in with intubation and respiratory failure.  Hours as patient is a DNR but reportedly  called family and they "wanted everything done so the patient was intubated.  Reportedly last seen last night. Past Medical History:  Diagnosis Date  . A-fib (HCC)   . CHF (congestive heart failure) (HCC)   . CKD (chronic kidney disease)   . COPD (chronic obstructive pulmonary disease) (HCC)   . Coronary artery disease   . Dementia   . Depression   . Dyslipidemia   . Hypertension   . Pacemaker   . RLS (restless legs syndrome)   . Valvular heart disease   . Vertigo     Patient Active Problem List   Diagnosis Date Noted  . COPD (chronic obstructive pulmonary disease) (HCC) December 12, 2017  . Infection due to ESBL-producing Escherichia coli 10/08/2017  . Pressure injury of buttock, stage 1   . UTI (urinary tract infection) 10/04/2017  . Chronic diastolic CHF (congestive heart failure) (HCC) 10/04/2017  . Pacemaker 10/04/2017  . Atrial fibrillation, chronic (HCC) 10/04/2017  . Dementia 10/04/2017  . Acute lower UTI 10/04/2017  . Fungal infection of the groin 10/04/2017    Past Surgical History:  Procedure Laterality Date  . PACEMAKER INSERTION      OB History    No data available       Home Medications    Prior to Admission medications   Medication Sig Start Date End Date Taking? Authorizing Provider  acetaminophen (TYLENOL) 325 MG tablet Take 650 mg by mouth every 6 (six) hours as needed for moderate pain.   Yes [provider]  acetaminophen (TYLENOL) 650 MG suppository Place 650 mg rectally every 4 (four) hours as needed for moderate pain.   Yes [provider]  apixaban (ELIQUIS) 5 MG TABS tablet Take 5 mg by mouth 2 (two) times daily.   Yes [provider]  atropine 1 % ophthalmic solution Place 1 drop under the tongue every 2 (two) hours as needed (increased secretions).   Yes [provider]  Cholecalciferol (THERA-D 2000) 2000 units TABS Take 2,000 Units by mouth daily with breakfast.   Yes [provider]  citalopram (CELEXA) 20 MG tablet Take 20 mg by mouth daily.    Yes [provider]  divalproex (DEPAKOTE) 250 MG DR tablet Take 250 mg by mouth 2 (two) times daily.   Yes [provider]  ENSURE PLUS (ENSURE PLUS) LIQD Take 237 mLs by mouth 3 (three) times daily between meals.   Yes [provider]  fluconazole (DIFLUCAN) 150 MG tablet Take 150 mg by mouth every 3 (three) days as needed (for yeast for 2 administrations).   Yes [provider]  fluticasone (FLONASE) 50 MCG/ACT nasal spray Place 2 sprays into both nostrils daily.   Yes [provider]  furosemide (LASIX) 20 MG tablet Take 20 mg by mouth every other day.    Yes [provider]  Infant Care Products (DERMACLOUD) CREA Apply 1 application topically as needed (to sacrum).   Yes [provider]  loperamide (KLS ANTI-DIARRHEAL) 2 MG tablet Take 2 mg by mouth daily as needed for diarrhea or  loose stools.    Yes [provider]  loratadine (CLARITIN) 10 MG tablet Take 10 mg by mouth daily with breakfast.   Yes [provider]  Melatonin 3 MG CAPS Take 3 mg by mouth at bedtime.    Yes [provider]  midodrine (PROAMATINE) 2.5 MG tablet Take 2.5 mg by mouth 3 (three) times daily with meals.   Yes [provider]  nitroGLYCERIN (NITROSTAT) 0.4 MG SL tablet Place 0.4 mg under the tongue every 5 (five) minutes as needed for chest pain.   Yes [provider]  nystatin (NYSTATIN) powder Apply 1 Bottle topically 2 (two) times daily as needed (for seven  days).    Yes [provider]  polyethylene glycol (MIRALAX / GLYCOLAX) packet Take 17 g by mouth daily as needed for mild constipation or moderate constipation.    Yes [provider]  pravastatin (PRAVACHOL) 20 MG tablet Take 20 mg by mouth at bedtime.   Yes [provider]  senna-docusate (SENOKOT-S) 8.6-50 MG tablet Take 1 tablet by mouth daily.   Yes [provider]  traZODone (DESYREL) 100 MG tablet Take 100 mg by mouth at bedtime.   Yes [provider]  vitamin B-12 (CYANOCOBALAMIN) 1000 MCG tablet Take 1,000 mcg by mouth daily with breakfast.    Yes [provider]    Family History History reviewed. No pertinent family history.  Social History Social History   Tobacco Use  . Smoking status: Never Smoker  . Smokeless tobacco: Never Used  Substance Use Topics  . Alcohol use: No    Frequency: Never  . Drug use: No     Allergies   Patient has no known allergies.   Review of Systems Review of Systems  Unable to perform ROS: Intubated     Physical Exam Updated Vital Signs BP (!) 103/51   Pulse 65   Temp (!) 101.3 F (38.5 C)   Resp 20   Ht 5\' 8"  (1.727 m)   Wt 68 kg (149 lb 14.6 oz) Comment: per previous admission  SpO2 94%   BMI 22.79 kg/m   Physical Exam  Constitutional: She appears well-developed.  HENT:  Head: Atraumatic.  Eyes:  Pupils are constricted  Neck: Neck supple.  Cardiovascular: Normal rate.  Pulmonary/Chest:  Patient is intubated.  Equal breath sounds bilaterally  Abdominal: She exhibits no distension.  Musculoskeletal: She exhibits edema.  Some edema bilateral lower extremities.  Neurological:  Patient will startle to stimulation.  Nonverbal and will not follow commands.  Intubated and breathing slowly  Skin: Skin is warm. Capillary refill takes less than 2 seconds.     ED Treatments / Results  Labs (all labs ordered are listed, but only abnormal results are displayed) Labs  Reviewed  COMPREHENSIVE METABOLIC PANEL - Abnormal; Notable for the following components:      Result Value   Chloride 99 (*)    Glucose, Bld 148 (*)    BUN 26 (*)    Creatinine, Ser 1.44 (*)    Calcium 8.4 (*)    Albumin 2.6 (*)    ALT 12 (*)    GFR calc non Af Amer 31 (*)    GFR calc Af Amer 36 (*)    All other components within normal limits  CBC WITH DIFFERENTIAL/PLATELET - Abnormal; Notable for the following components:   RBC 2.88 (*)    Hemoglobin 9.1 (*)    HCT 29.9 (*)    MCV 103.8 (*)  Monocytes Absolute 1.5 (*)    All other components within normal limits  URINALYSIS, ROUTINE W REFLEX MICROSCOPIC - Abnormal; Notable for the following components:   Color, Urine AMBER (*)    APPearance CLOUDY (*)    Glucose, UA 50 (*)    Hgb urine dipstick LARGE (*)    Protein, ur 100 (*)    Bacteria, UA RARE (*)    Squamous Epithelial / LPF 0-5 (*)    All other components within normal limits  TROPONIN I - Abnormal; Notable for the following components:   Troponin I 0.07 (*)    All other components within normal limits  VALPROIC ACID LEVEL - Abnormal; Notable for the following components:   Valproic Acid Lvl 22 (*)    All other components within normal limits  PROTIME-INR - Abnormal; Notable for the following components:   Prothrombin Time 16.3 (*)    All other components within normal limits  LACTIC ACID, PLASMA - Abnormal; Notable for the following components:   Lactic Acid, Venous 3.4 (*)    All other components within normal limits  CBG MONITORING, ED - Abnormal; Notable for the following components:   Glucose-Capillary 147 (*)    All other components within normal limits  I-STAT ARTERIAL BLOOD GAS, ED - Abnormal; Notable for the following components:   pH, Arterial 7.275 (*)    pCO2 arterial 71.0 (*)    pO2, Arterial 426.0 (*)    Bicarbonate 32.7 (*)    TCO2 35 (*)    Acid-Base Excess 5.0 (*)    All other components within normal limits  I-STAT ARTERIAL BLOOD GAS,  ED - Abnormal; Notable for the following components:   pH, Arterial 7.326 (*)    pCO2 arterial 53.9 (*)    pO2, Arterial 64.0 (*)    Bicarbonate 28.5 (*)    All other components within normal limits  CBG MONITORING, ED - Abnormal; Notable for the following components:   Glucose-Capillary 155 (*)    All other components within normal limits  RESPIRATORY PANEL BY PCR  CULTURE, BLOOD (ROUTINE X 2)  CULTURE, BLOOD (ROUTINE X 2)  CULTURE, RESPIRATORY (NON-EXPECTORATED)  TRIGLYCERIDES  AMMONIA  BLOOD GAS, ARTERIAL  PROCALCITONIN  LACTIC ACID, PLASMA  HEMOGLOBIN A1C  STREP PNEUMONIAE URINARY ANTIGEN  URINALYSIS, ROUTINE W REFLEX MICROSCOPIC    EKG  EKG Interpretation None       Radiology Dg Chest Portable 1 View  Result Date: 10/22/2017 CLINICAL DATA:  Respiratory failure and intubation. EXAM: PORTABLE CHEST 1 VIEW COMPARISON:  10/06/2017 FINDINGS: Endotracheal tube present with the tip approximately 3 cm above the carina. Stable appearance of single chamber pacemaker. Lungs show stable chronic disease. Additional opacities in both lower lungs may represent atelectasis versus pneumonia. No overt pulmonary edema, significant pleural effusions or evidence of pneumothorax. The heart size is stable. IMPRESSION: 1. Endotracheal tube tip approximately 3 cm above the carina. 2. Airspace opacities in both lower lung zones consistent with atelectasis versus pneumonia. Electronically Signed   By: Irish Lack M.D.   On: 11/03/2017 11:41    Procedures Procedures (including critical care time)  Medications Ordered in ED Medications  midazolam (VERSED) 2 MG/2ML injection (not administered)  fentaNYL (SUBLIMAZE) injection 50 mcg (50 mcg Intravenous Given 11/02/2017 1522)  fentaNYL (SUBLIMAZE) injection 50 mcg (not administered)  midazolam (VERSED) injection 1 mg (2 mg Intravenous Given 11/17/2017 1430)  midazolam (VERSED) injection 1 mg (not administered)  ipratropium-albuterol (DUONEB) 0.5-2.5  (3) MG/3ML nebulizer solution 3 mL (3  mLs Nebulization Given 11/01/2017 1353)  albuterol (PROVENTIL) (2.5 MG/3ML) 0.083% nebulizer solution 2.5 mg (not administered)  propofol (DIPRIVAN) 1000 MG/100ML infusion (not administered)  propofol (DIPRIVAN) 1000 MG/100ML infusion (20 mcg/kg/min  68 kg Intravenous Rate/Dose Change 11/06/2017 1524)  dextrose 5 % in lactated ringers infusion ( Intravenous New Bag/Given 11/17/2017 1509)  piperacillin-tazobactam (ZOSYN) IVPB 3.375 g (not administered)  azithromycin (ZITHROMAX) 500 mg in dextrose 5 % 250 mL IVPB (500 mg Intravenous New Bag/Given 11/07/2017 1525)  methylPREDNISolone sodium succinate (SOLU-MEDROL) 125 mg/2 mL injection 60 mg (60 mg Intravenous Given 10/24/2017 1523)  insulin aspart (novoLOG) injection 0-9 Units (not administered)  0.9 %  sodium chloride infusion (not administered)  pantoprazole (PROTONIX) injection 40 mg (not administered)  midazolam (VERSED) 2 MG/2ML injection (not administered)  fentaNYL (SUBLIMAZE) 100 MCG/2ML injection (not administered)  midazolam (VERSED) injection 2 mg (2 mg Intravenous Given 11/16/2017 1100)     Initial Impression / Assessment and Plan / ED Course  I have reviewed the triage vital signs and the nursing notes.  Pertinent labs & imaging results that were available during my care of the patient were reviewed by me and considered in my medical decision making (see chart for details).     Patient with mental status changes and respiratory failure.  Intubated by EMS.  Patient had been a DNR.  Non-febrile upon arrival.  However later spiked temperature.  X-ray atelectasis versus infiltrate.  Discussed with Dr. Wallace Cullens from critical care, who admitted the patient.  CRITICAL CARE Performed by: Benjiman Core Total critical care time: 30 minutes Critical care time was exclusive of separately billable procedures and treating other patients. Critical care was necessary to treat or prevent imminent or life-threatening  deterioration. Critical care was time spent personally by me on the following activities: development of treatment plan with patient and/or surrogate as well as nursing, discussions with consultants, evaluation of patient's response to treatment, examination of patient, obtaining history from patient or surrogate, ordering and performing treatments and interventions, ordering and review of laboratory studies, ordering and review of radiographic studies, pulse oximetry and re-evaluation of patient's condition.   Final Clinical Impressions(s) / ED Diagnoses   Final diagnoses:  Acute respiratory failure, unspecified whether with hypoxia or hypercapnia [facility])    ED Discharge Orders    None       Benjiman Core, MD 10/25/2017 1601

## 2017-11-13 NOTE — Progress Notes (Signed)
ABG critical values given to Dr. Waldron SessionW. Gray.  RR changed to 20 and FIO2 changed to 50% per verbal order.

## 2017-11-14 ENCOUNTER — Inpatient Hospital Stay (HOSPITAL_COMMUNITY): Payer: Medicare Other

## 2017-11-14 LAB — LACTIC ACID, PLASMA
LACTIC ACID, VENOUS: 5.5 mmol/L — AB (ref 0.5–1.9)
LACTIC ACID, VENOUS: 4 mmol/L — AB (ref 0.5–1.9)
LACTIC ACID, VENOUS: 3.8 mmol/L — AB (ref 0.5–1.9)

## 2017-11-14 LAB — BLOOD CULTURE ID PANEL (REFLEXED)
Acinetobacter baumannii: NOT DETECTED
CANDIDA GLABRATA: NOT DETECTED
CANDIDA TROPICALIS: NOT DETECTED
Candida albicans: NOT DETECTED
Candida krusei: NOT DETECTED
Candida parapsilosis: NOT DETECTED
Enterobacter cloacae complex: NOT DETECTED
Enterobacteriaceae species: NOT DETECTED
Enterococcus species: NOT DETECTED
Escherichia coli: NOT DETECTED
HAEMOPHILUS INFLUENZAE: DETECTED — AB
KLEBSIELLA PNEUMONIAE: NOT DETECTED
Klebsiella oxytoca: NOT DETECTED
Listeria monocytogenes: NOT DETECTED
NEISSERIA MENINGITIDIS: NOT DETECTED
PROTEUS SPECIES: NOT DETECTED
PSEUDOMONAS AERUGINOSA: NOT DETECTED
SERRATIA MARCESCENS: NOT DETECTED
STAPHYLOCOCCUS AUREUS BCID: NOT DETECTED
STAPHYLOCOCCUS SPECIES: NOT DETECTED
STREPTOCOCCUS AGALACTIAE: NOT DETECTED
STREPTOCOCCUS PNEUMONIAE: NOT DETECTED
Streptococcus pyogenes: NOT DETECTED
Streptococcus species: NOT DETECTED

## 2017-11-14 LAB — BASIC METABOLIC PANEL
ANION GAP: 14 (ref 5–15)
BUN: 33 mg/dL — ABNORMAL HIGH (ref 6–20)
CALCIUM: 7.9 mg/dL — AB (ref 8.9–10.3)
CO2: 20 mmol/L — ABNORMAL LOW (ref 22–32)
CREATININE: 1.65 mg/dL — AB (ref 0.44–1.00)
Chloride: 102 mmol/L (ref 101–111)
GFR, EST AFRICAN AMERICAN: 30 mL/min — AB (ref >60)
GFR, EST NON AFRICAN AMERICAN: 26 mL/min — AB (ref >60)
Glucose, Bld: 183 mg/dL — ABNORMAL HIGH (ref 65–99)
Potassium: 3.8 mmol/L (ref 3.5–5.1)
Sodium: 136 mmol/L (ref 135–145)

## 2017-11-14 LAB — MAGNESIUM: MAGNESIUM: 1.8 mg/dL (ref 1.7–2.4)

## 2017-11-14 LAB — CBC
HCT: 28.1 % — ABNORMAL LOW (ref 36.0–46.0)
HEMOGLOBIN: 8.9 g/dL — AB (ref 12.0–15.0)
MCH: 31.8 pg (ref 26.0–34.0)
MCHC: 31.7 g/dL (ref 30.0–36.0)
MCV: 100.4 fL — ABNORMAL HIGH (ref 78.0–100.0)
PLATELETS: 206 10*3/uL (ref 150–400)
RBC: 2.8 MIL/uL — AB (ref 3.87–5.11)
RDW: 13.6 % (ref 11.5–15.5)
WBC: 9.3 10*3/uL (ref 4.0–10.5)

## 2017-11-14 LAB — GLUCOSE, CAPILLARY
GLUCOSE-CAPILLARY: 153 mg/dL — AB (ref 65–99)
GLUCOSE-CAPILLARY: 223 mg/dL — AB (ref 65–99)
Glucose-Capillary: 168 mg/dL — ABNORMAL HIGH (ref 65–99)

## 2017-11-14 LAB — PHOSPHORUS: PHOSPHORUS: 2.8 mg/dL (ref 2.5–4.6)

## 2017-11-14 MED ORDER — SODIUM CHLORIDE 0.9 % IV BOLUS (SEPSIS)
500.0000 mL | Freq: Once | INTRAVENOUS | Status: AC
  Administered 2017-11-14: 500 mL via INTRAVENOUS

## 2017-11-14 MED ORDER — MAGNESIUM SULFATE 2 GM/50ML IV SOLN
2.0000 g | Freq: Once | INTRAVENOUS | Status: AC
  Administered 2017-11-14: 2 g via INTRAVENOUS
  Filled 2017-11-14: qty 50

## 2017-11-14 MED ORDER — MORPHINE SULFATE (PF) 2 MG/ML IV SOLN: 2.0000 mg | INTRAVENOUS | Status: DC | PRN

## 2017-11-14 MED ORDER — SODIUM CHLORIDE 0.9 % IV BOLUS (SEPSIS)
1000.0000 mL | Freq: Once | INTRAVENOUS | Status: AC
  Administered 2017-11-14: 1000 mL via INTRAVENOUS

## 2017-11-14 MED ORDER — MORPHINE BOLUS VIA INFUSION
5.0000 mg | INTRAVENOUS | Status: DC | PRN
  Administered 2017-11-15 (×2): 10 mg via INTRAVENOUS
  Filled 2017-11-14: qty 20

## 2017-11-14 MED ORDER — MORPHINE SULFATE 25 MG/ML IV SOLN
10.0000 mg/h | INTRAVENOUS | Status: DC
  Administered 2017-11-14: 10 mg/h via INTRAVENOUS
  Filled 2017-11-14: qty 10

## 2017-11-14 MED ORDER — SODIUM CHLORIDE 0.9 % IV SOLN
INTRAVENOUS | Status: DC
  Administered 2017-11-14 – 2017-11-15 (×2): via INTRAVENOUS

## 2017-11-14 MED ORDER — INSULIN ASPART 100 UNIT/ML ~~LOC~~ SOLN
2.0000 [IU] | SUBCUTANEOUS | Status: DC
  Administered 2017-11-14: 6 [IU] via SUBCUTANEOUS
  Administered 2017-11-14: 4 [IU] via SUBCUTANEOUS

## 2017-11-14 NOTE — Progress Notes (Signed)
Name: Mikayla Boyd MRN: [id number] DOB: 29-Aug-1924    ADMISSION DATE:  December 01, 2017  REFERRING MD :  EDP  CHIEF COMPLAINT:  Respiratory failure, AMS   BRIEF PATIENT DESCRIPTION: 84 yo female SNF resident with underlying dementia, AFib, CHF, COPD, CKD presented 1/26 from SNF with AMS requiring intubation for airway protection.  Minimal hx provided from SNF.   SIGNIFICANT EVENTS    STUDIES:  CT head 1/26>>> neg acute   SUBJECTIVE:  No change overnight.    VITAL SIGNS: Temp:  [97 F (36.1 C)-104.2 F (40.1 C)] 97.5 F (36.4 C) (01/27 0800) Pulse Rate:  [64-70] 65 (01/27 0815) Resp:  [15-26] 20 (01/27 0815) BP: (83-125)/(44-83) 92/48 (01/27 0815) SpO2:  [92 %-100 %] 98 % (01/27 0815) FiO2 (%):  [40 %-100 %] 40 % (01/27 0815) Weight:  [66.5 kg (146 lb 9.7 oz)-68 kg (149 lb 14.6 oz)] 66.5 kg (146 lb 9.7 oz) (01/27 0357)  PHYSICAL EXAMINATION: General:  Elderly female, NAD  Neuro:  Sedated on vent, RASS -1, opens eyes to loud voice, does not follow commands  HEENT:  Mm moist, no ETT Cardiovascular:  s1s2 rrr Lungs:  resps even non labored on vent, few exp wheeze  Abdomen:  Round, soft, +bs  Musculoskeletal:  Warm and dry, no edema    Recent Labs  Lab 2017-12-01 1134 11/14/17 0444  NA 136 136  K 4.7 3.8  CL 99* 102  CO2 26 20*  BUN 26* 33*  CREATININE 1.44* 1.65*  GLUCOSE 148* 183*   Recent Labs  Lab 12-01-17 1134 11/14/17 0444  HGB 9.1* 8.9*  HCT 29.9* 28.1*  WBC 9.5 9.3  PLT 219 206   Ct Head Wo Contrast  Result Date: 2017-12-01 CLINICAL DATA:  Altered level of consciousness EXAM: CT HEAD WITHOUT CONTRAST TECHNIQUE: Contiguous axial images were obtained from the base of the skull through the vertex without intravenous contrast. Sagittal and coronal MPR images reconstructed from axial data set. COMPARISON:  09/03/2017 FINDINGS: Brain: Generalized atrophy. Normal ventricular morphology. No midline shift or mass effect. Small vessel chronic ischemic changes  of deep cerebral white matter. No intracranial hemorrhage, mass lesion, evidence of acute infarction, or extra-axial fluid collection. Vascular: Unremarkable Skull: Intact Sinuses/Orbits: Chronic incomplete opacification of the RIGHT mastoid air cells. LEFT mastoid air cells and visualized paranasal sinuses clear Other: N/A IMPRESSION: Atrophy with small vessel chronic ischemic changes of deep cerebral white matter. No acute intracranial abnormalities. Electronically Signed   By: Ulyses Southward M.D.   On: December 01, 2017 18:19   Dg Chest Port 1 View  Result Date: 11/14/2017 CLINICAL DATA:  COPD.  Ventilator support. EXAM: PORTABLE CHEST 1 VIEW COMPARISON:  01-Dec-2017 FINDINGS: Endotracheal tube tip is 3 cm above the carina. Nasogastric tube enters the abdomen. Single lead pacemaker again noted. There is worsened volume loss in the left lower lobe. Mild patchy density at the right lung base. Upper lungs are clear. IMPRESSION: Worsened left lower lobe infiltrate/volume loss. Mild patchy density in the right lower lobe as well. Electronically Signed   By: Paulina Fusi M.D.   On: 11/14/2017 07:40   Dg Chest Portable 1 View  Result Date: 12/01/17 CLINICAL DATA:  Respiratory failure and intubation. EXAM: PORTABLE CHEST 1 VIEW COMPARISON:  10/06/2017 FINDINGS: Endotracheal tube present with the tip approximately 3 cm above the carina. Stable appearance of single chamber pacemaker. Lungs show stable chronic disease. Additional opacities in both lower lungs may represent atelectasis versus pneumonia. No overt pulmonary edema, significant pleural effusions  or evidence of pneumothorax. The heart size is stable. IMPRESSION: 1. Endotracheal tube tip approximately 3 cm above the carina. 2. Airspace opacities in both lower lung zones consistent with atelectasis versus pneumonia. Electronically Signed   By: Irish LackGlenn  Yamagata M.D.   On: 10/21/2017 11:41    ASSESSMENT / PLAN:  AMS - unclear etiology.  CT neg.  Likely r/t.     Acute respiratory failure - likely r/t AECOPD +/- PNA v CNS event.  Hx AFib  COPD  CHF    PLAN -  Continue vent for now  Family to arrive this am and likely withdraw care  Continue steroids, BD's No further labs, xrays  Hold anticoagulation  Continue propofol for now  Will add PRN morphine    Discussed with son Loraine LericheMark via phone.  Pt is DNR and would not want mechanical ventilation.  He and family are going to arrive at some point this am and will likely want to withdraw when they arrive.   Dirk DressKaty Vanden Fawaz, NP 11/14/2017  9:03 AM Pager: [phone] or [phone]

## 2017-11-14 NOTE — Plan of Care (Signed)
  Interdisciplinary Goals of Care Family Meeting   Date carried out:: 11/14/2017  Location of the meeting: Conference room  Member's involved: Physician and Family Member or next of kin  Durable Power of Attorney or acting medical decision maker: Son Merchant navy officerMark    Discussion: We discussed goals of care for Mikayla Boyd .  She has previously stated that she did not wish to be a full code in the event of severe/critical illness.  I explained that she is currently critically ill with a UTI and respiratory failure. They voiced understanding.  Due to her prior wishes they desire to withdraw care.  They wish to wait until her daughter arrives.   Code status: Full DNR  Disposition: In-patient comfort care  Time spent for the meeting: 35 minutes  Max FickleDouglas Senica Crall 11/14/2017, 3:48 PM '

## 2017-11-14 NOTE — Progress Notes (Signed)
PHARMACY - PHYSICIAN COMMUNICATION CRITICAL VALUE ALERT - BLOOD CULTURE IDENTIFICATION (BCID)  Mikayla Boyd is an 84 y.o. female who [facility] unresponsive from NH.   Assessment:  The patient was intubated on arrival and MD was covering empirically for aspiration PNA and CAP. Family made the decision earlier today to withdraw care.   Name of physician (or Provider) Contacted: [facility]  Current antibiotics: None  Changes to prescribed antibiotics recommended: None - pt is comfort care  Results for orders placed or performed during the hospital encounter of 03/31/2018  Blood Culture ID Panel (Reflexed) (Collected: 10-20-2017  3:00 PM)  Result Value Ref Range   Enterococcus species NOT DETECTED NOT DETECTED   Listeria monocytogenes NOT DETECTED NOT DETECTED   Staphylococcus species NOT DETECTED NOT DETECTED   Staphylococcus aureus NOT DETECTED NOT DETECTED   Streptococcus species NOT DETECTED NOT DETECTED   Streptococcus agalactiae NOT DETECTED NOT DETECTED   Streptococcus pneumoniae NOT DETECTED NOT DETECTED   Streptococcus pyogenes NOT DETECTED NOT DETECTED   Acinetobacter baumannii NOT DETECTED NOT DETECTED   Enterobacteriaceae species NOT DETECTED NOT DETECTED   Enterobacter cloacae complex NOT DETECTED NOT DETECTED   Escherichia coli NOT DETECTED NOT DETECTED   Klebsiella oxytoca NOT DETECTED NOT DETECTED   Klebsiella pneumoniae NOT DETECTED NOT DETECTED   Proteus species NOT DETECTED NOT DETECTED   Serratia marcescens NOT DETECTED NOT DETECTED   Haemophilus influenzae DETECTED (A) NOT DETECTED   Neisseria meningitidis NOT DETECTED NOT DETECTED   Pseudomonas aeruginosa NOT DETECTED NOT DETECTED   Candida albicans NOT DETECTED NOT DETECTED   Candida glabrata NOT DETECTED NOT DETECTED   Candida krusei NOT DETECTED NOT DETECTED   Candida parapsilosis NOT DETECTED NOT DETECTED   Candida tropicalis NOT DETECTED NOT DETECTED     Rolley SimsMartin, Braxtyn Dorff Ann 11/14/2017  7:37 PM

## 2017-11-15 DIAGNOSIS — J449 Chronic obstructive pulmonary disease, unspecified: Secondary | ICD-10-CM

## 2017-11-15 DIAGNOSIS — Z7189 Other specified counseling: Secondary | ICD-10-CM

## 2017-11-15 DIAGNOSIS — Z515 Encounter for palliative care: Secondary | ICD-10-CM

## 2017-11-15 DIAGNOSIS — J9601 Acute respiratory failure with hypoxia: Secondary | ICD-10-CM

## 2017-11-15 LAB — URINE CULTURE: CULTURE: NO GROWTH

## 2017-11-15 MED ORDER — SODIUM CHLORIDE 0.9 % IV SOLN
10.0000 mg/h | INTRAVENOUS | Status: DC
  Filled 2017-11-15: qty 10

## 2017-11-15 MED ORDER — MORPHINE BOLUS VIA INFUSION
5.0000 mg | INTRAVENOUS | Status: DC | PRN
  Filled 2017-11-15: qty 20

## 2017-11-16 LAB — CULTURE, RESPIRATORY W GRAM STAIN

## 2017-11-16 LAB — CULTURE, RESPIRATORY

## 2017-11-18 ENCOUNTER — Telehealth: Payer: Self-pay

## 2017-11-18 LAB — CULTURE, BLOOD (ROUTINE X 2): Special Requests: ADEQUATE

## 2017-11-18 NOTE — Telephone Encounter (Signed)
On 11/18/17 I received a d/c from [employer].  The d/c is for cremation. The patient is a patient of Doctor Molli KnockYacoub.   [name].  On 11/19/17 I received the d/c back from Doctor Molli KnockYacoub. I got the d/c ready and called the funeral home to let them know the d/c is ready for pickup. I also faxed a copy to the funeral home per the funeral home request.

## 2017-11-19 NOTE — Progress Notes (Signed)
Pt was extubated at 1225 per withdrawal orders. Family at bedside. Morphine bolus given per order pt seem to be uncomfortable. Family wanted morphine increased. Order states bolus every 15 mins and increase rate 25%. Morphine rate is now at 12.5. Will continue to monitor pt.

## 2017-11-19 NOTE — Progress Notes (Signed)
CSW consulted for comfort care. CSW went to speak/support any family at bedside, however none present at this time. CSW remains available for support at this time.      Claude MangesKierra S. Riti Rollyson, MSW, LCSW-A Emergency Department Clinical Social Worker [phone]

## 2017-11-19 NOTE — Progress Notes (Signed)
   2018/01/25 1100  Clinical Encounter Type  Visited With Health care provider  Visit Type (EOL)  Referral From Nurse  Consult/Referral To Chaplain   Responded to a [facility] for end of life.  Patient was alone and not aware.  Family not present at this time.  Let the staff know when family arrives we can support them. Will follow as needed. Chaplain Agustin CreeNewton Alejandria Wessells

## 2017-11-19 NOTE — Progress Notes (Signed)
Name: Mikayla Boyd MRN: [id number] DOB: 06/28/1924    ADMISSION DATE:  Nov 14, 2017  REFERRING MD :  EDP  CHIEF COMPLAINT:  Respiratory failure, AMS   BRIEF PATIENT DESCRIPTION: 84 yo female SNF resident with underlying dementia, AFib, CHF, COPD, CKD presented 1/26 from SNF with AMS requiring intubation for airway protection.  Minimal hx provided from SNF.   SIGNIFICANT EVENTS    STUDIES:  CT head 1/26>>> neg acute   SUBJECTIVE:  No events overnight, patient is completely unresponsive  VITAL SIGNS: Temp:  [97.9 F (36.6 C)-99.7 F (37.6 C)] 99.3 F (37.4 C) (01/28 1100) Pulse Rate:  [64-65] 65 (01/28 1100) Resp:  [19-20] 20 (01/28 1100) BP: (74-117)/(42-59) 91/51 (01/28 1100) SpO2:  [93 %-97 %] 95 % (01/28 1100) FiO2 (%):  [40 %] 40 % (01/28 1200)  PHYSICAL EXAMINATION: General:  Elderly female, NAD  Neuro:  Sedated on vent, RASS -1, opens eyes to loud voice, does not follow commands  HEENT:  Mm moist, no ETT Cardiovascular:  s1s2 rrr Lungs:  resps even non labored on vent, few exp wheeze  Abdomen:  Round, soft, +bs  Musculoskeletal:  Warm and dry, no edema   Recent Labs  Lab November 14, 2017 1134 11/14/17 0444  NA 136 136  K 4.7 3.8  CL 99* 102  CO2 26 20*  BUN 26* 33*  CREATININE 1.44* 1.65*  GLUCOSE 148* 183*   Recent Labs  Lab 2017-11-14 1134 11/14/17 0444  HGB 9.1* 8.9*  HCT 29.9* 28.1*  WBC 9.5 9.3  PLT 219 206   Ct Head Wo Contrast  Result Date: 14-Nov-2017 CLINICAL DATA:  Altered level of consciousness EXAM: CT HEAD WITHOUT CONTRAST TECHNIQUE: Contiguous axial images were obtained from the base of the skull through the vertex without intravenous contrast. Sagittal and coronal MPR images reconstructed from axial data set. COMPARISON:  09/03/2017 FINDINGS: Brain: Generalized atrophy. Normal ventricular morphology. No midline shift or mass effect. Small vessel chronic ischemic changes of deep cerebral white matter. No intracranial hemorrhage, mass lesion,  evidence of acute infarction, or extra-axial fluid collection. Vascular: Unremarkable Skull: Intact Sinuses/Orbits: Chronic incomplete opacification of the RIGHT mastoid air cells. LEFT mastoid air cells and visualized paranasal sinuses clear Other: N/A IMPRESSION: Atrophy with small vessel chronic ischemic changes of deep cerebral white matter. No acute intracranial abnormalities. Electronically Signed   By: Ulyses Southward M.D.   On: 2017/11/14 18:19   Dg Chest Port 1 View  Result Date: 11/14/2017 CLINICAL DATA:  COPD.  Ventilator support. EXAM: PORTABLE CHEST 1 VIEW COMPARISON:  11-14-2017 FINDINGS: Endotracheal tube tip is 3 cm above the carina. Nasogastric tube enters the abdomen. Single lead pacemaker again noted. There is worsened volume loss in the left lower lobe. Mild patchy density at the right lung base. Upper lungs are clear. IMPRESSION: Worsened left lower lobe infiltrate/volume loss. Mild patchy density in the right lower lobe as well. Electronically Signed   By: Paulina Fusi M.D.   On: 11/14/2017 07:40    ASSESSMENT / PLAN:  AMS - unclear etiology.  CT neg.  Likely r/t.   Acute respiratory failure - likely r/t AECOPD +/- PNA v CNS event.  Hx AFib  COPD  CHF   PLAN -  Spoke with family at length, they were clear that patient never wanted to be on life support.  After discussion, decision was made to start morphine for comfort, extubate and allow patient to pass naturally.  Orders placed and family updated.  The patient is critically  ill with multiple organ systems failure and requires high complexity decision making for assessment and support, frequent evaluation and titration of therapies, application of advanced monitoring technologies and extensive interpretation of multiple databases.   Critical Care Time devoted to patient care services described in this note is  35  Minutes. This time reflects time of care of this signee Dr Koren BoundWesam Cire Deyarmin. This critical care time does not reflect  procedure time, or teaching time or supervisory time of PA/NP/Med student/Med Resident etc but could involve care discussion time.  Alyson ReedyWesam G. Rishon Thilges, M.D. [facility]/Critical Care Medicine. Pager: [phone]. After hours pager: [phone].

## 2017-11-19 NOTE — Progress Notes (Signed)
Pt went asystole at 1526. Pt was pulseless and breathless. Verified by 2 RN myself Clarita Crane(Winfrey Chillemi RN) and Velva HarmanLaura RN. ICD in place, rhythm placed in chart. MD notified.

## 2017-11-19 NOTE — Progress Notes (Signed)
Pt extubated per withdrawal order. Family and RN at bedside. 

## 2017-11-19 NOTE — Discharge Summary (Signed)
[name], Mikayla Boyd           ACCOUNT NO.:  [id number]  MEDICAL RECORD NO.:  [id number]  LOCATION:                               FACILITY:  [facility]  PHYSICIAN:  Felipa EvenerWesam Jake Melbert Botelho, MD  DATE OF BIRTH:  05-Apr-1924  DATE OF ADMISSION:  11/06/2017 DATE OF DISCHARGE:  01/02/18                              DISCHARGE SUMMARY   DEATH SUMMARY  PRIMARY DIAGNOSIS/CAUSE OF DEATH:  Healthcare-acquired pneumonia.  SECONDARY DIAGNOSES:  Acute respiratory failure, dementia, atrial fibrillation, chronic obstructive pulmonary disease, chronic systolic congestive heart failure, acute encephalopathy.  The patient is a 84 year old female, who presented to the ER from nursing home with altered mental status and inability to protect her airway.  Concern was raised for an HCAP versus aspiration pneumonia. The [facility].  The day after admission, the family arrived, informed the patient would never want to be put on life support.  She has made herself DNR in the past and requested that we transition to comfort care at which point the patient was started on morphine drip and was extubated, expired shortly thereafter with family at bedside.     Felipa EvenerWesam Jake Kagen Kunath, MD     WJY/MEDQ  D:  11/17/2017  T:  11/17/2017  Job:  [id number]

## 2017-11-19 NOTE — Progress Notes (Signed)
Nutrition Brief Note  Chart reviewed. Pt now transitioning to comfort care.  No nutrition interventions warranted at this time.  Please consult as needed.   Rolla Kedzierski, RD, LDN, CNSC Pager [phone] After Hours Pager [phone]    

## 2017-11-19 NOTE — Progress Notes (Signed)
   2018/06/24 1200  Clinical Encounter Type  Visited With Patient and family together;Health care provider  Visit Type Patient actively dying  Referral From Nurse  Consult/Referral To Chaplain  Spiritual Encounters  Spiritual Needs Emotional;Prayer  Stress Factors  Family Stress Factors Loss   Followed back up on this patient.  Family had arrived Son, 2 grandsons of the patient and the Son's wife.  We entered and Physician explained about the process and the nurse supported.  The family had a chance to share about the patient's life.  We prayed together.  Will follow as needed. Chaplain Agustin CreeNewton Janos Shampine

## 2017-11-19 DEATH — deceased

## 2017-11-30 LAB — CULTURE, BLOOD (ROUTINE X 2): Special Requests: ADEQUATE

## 2018-01-26 LAB — MISCELLANEOUS TEST

## 2019-07-26 IMAGING — CT CT CERVICAL SPINE W/O CM
4 of 8 series · 13 of 33 positions shown, 14 images · non-contrast
Comparison: Head CT dated 07/15/2017

CLINICAL DATA: [AGE] female with fall.

EXAM:
CT HEAD WITHOUT CONTRAST
CT CERVICAL SPINE WITHOUT CONTRAST
TECHNIQUE: Multidetector CT imaging of the head and cervical spine was
performed following the standard protocol without intravenous
contrast. Multiplanar CT image reconstructions of the cervical spine
were also generated.

[Series 5: coronal · coronal · 0.30mm/px · 3 of 60 slices shown]
[im 15/60  bone]
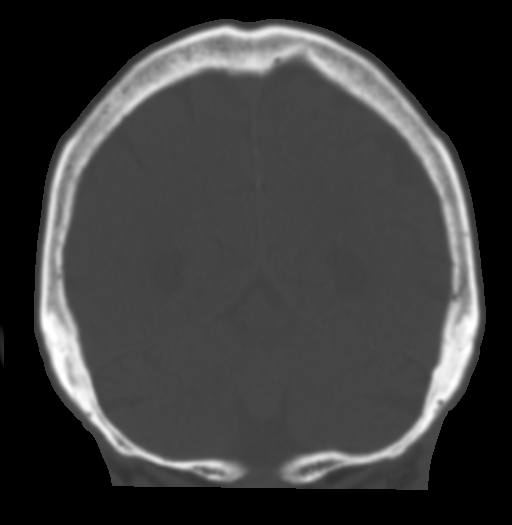
[im 30/60  bone]
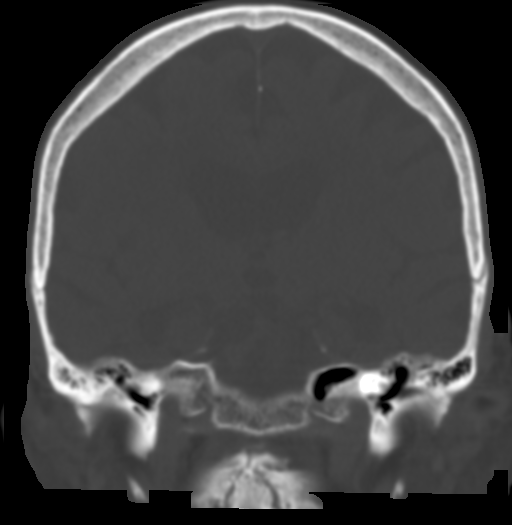
[im 45/60  bone]
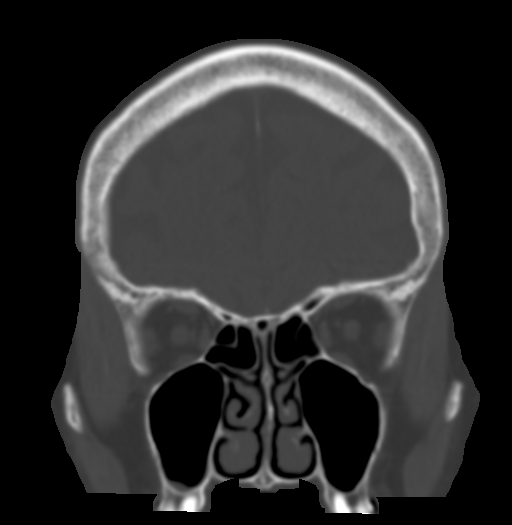

[Series 6: sagittal · sagittal · 0.30mm/px · 5 of 48 slices shown]
[im 8/48  bone]
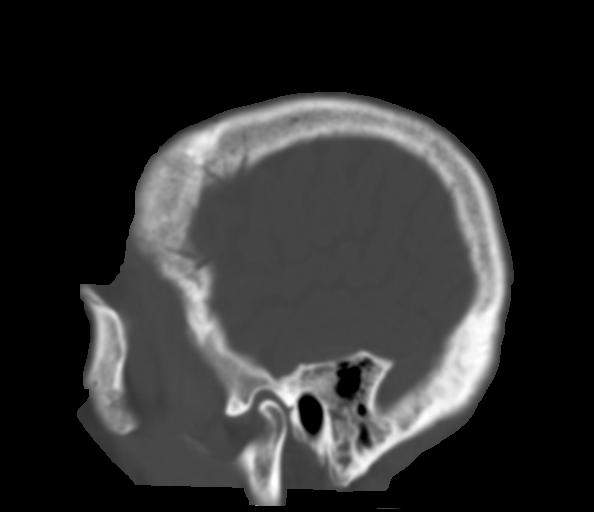
[im 16/48  bone]
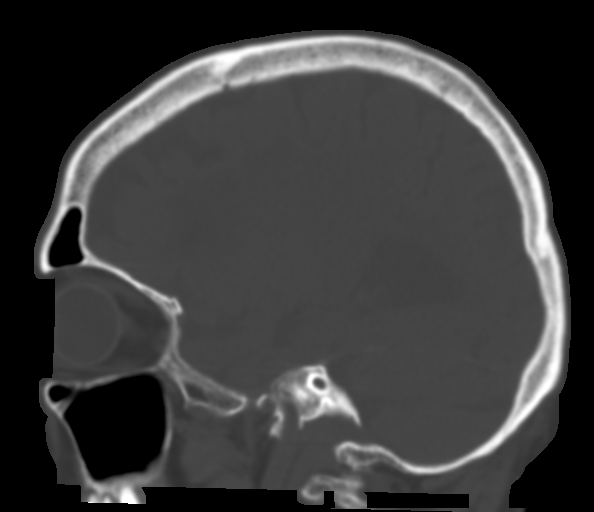
[im 24/48  bone]
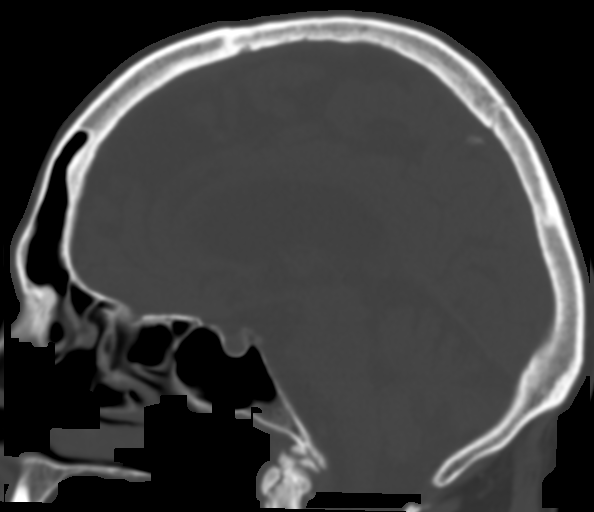
[im 32/48  bone]
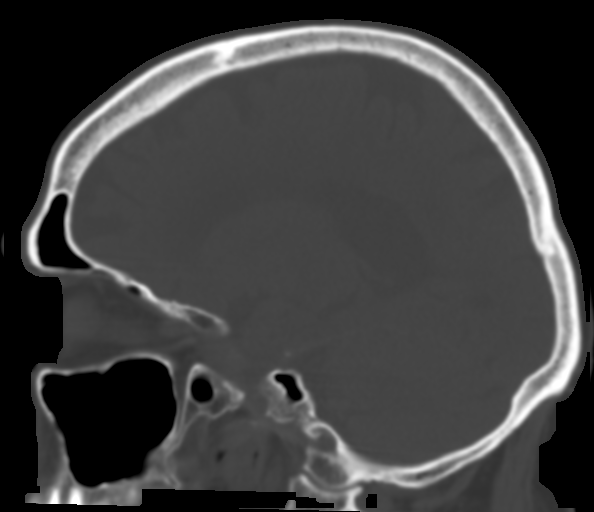
[im 40/48  bone]
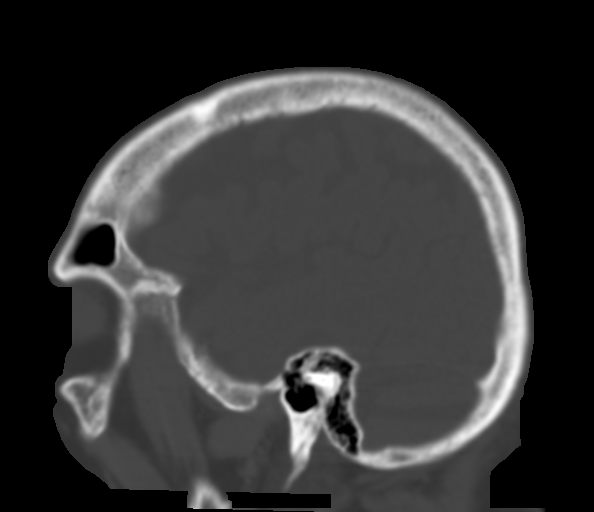

[Series 7: c-spine st · axial · 0.40mm/px · z∈[+1300,+1354]mm · 2 of 82 slices shown]
[im 28/82  bone]
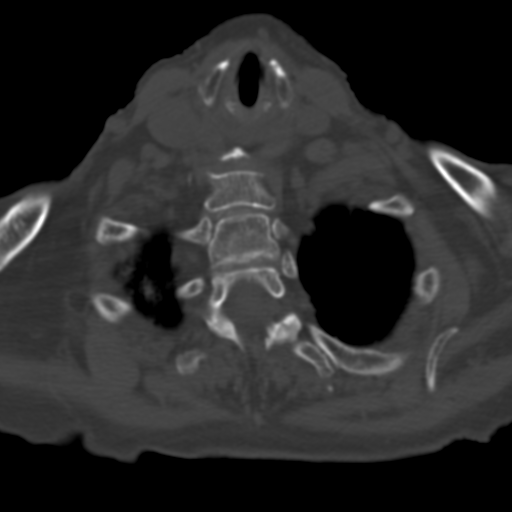
[im 55/82  bone]
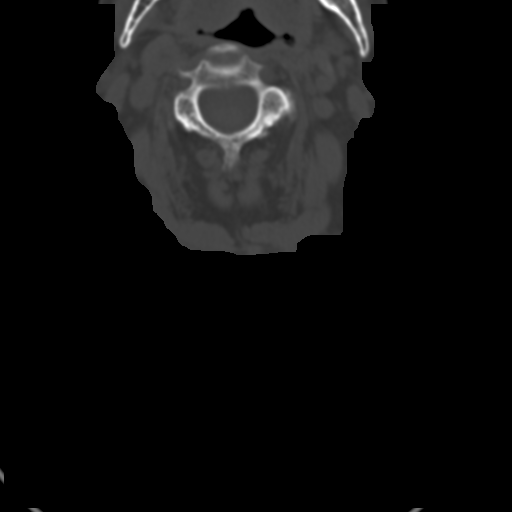

[Series 9: axial recon · axial · 0.23mm/px · z∈[+1251,+1329]mm · 3 of 95 slices shown, 4 images]
[im 24/95  soft-tissue]
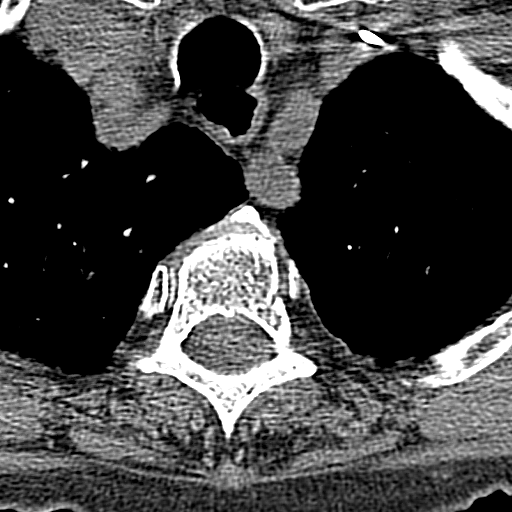
[im 24/95  bone]
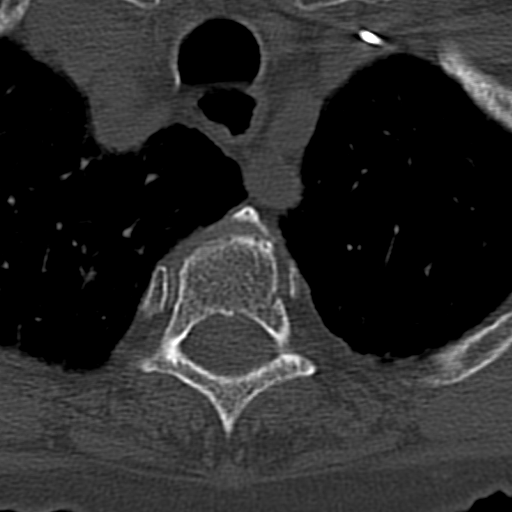
[im 48/95  bone]
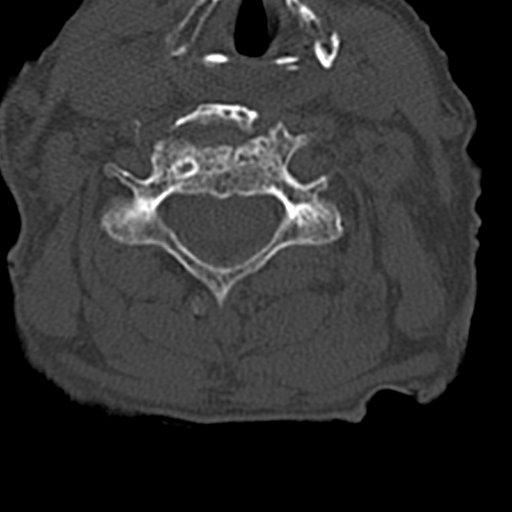
[im 71/95  bone]
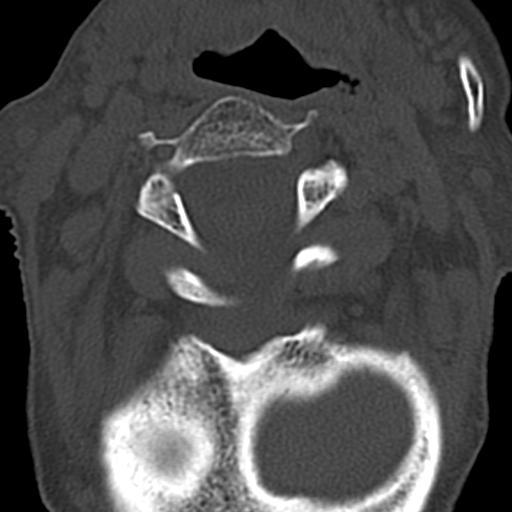

[13 of 33 positions shown; findings below may reference images not displayed]

FINDINGS: CT HEAD FINDINGS

Brain: There is age-related atrophy and chronic microvascular
ischemic changes. There is no acute intracranial hemorrhage. No mass
effect or midline shift noted. No extra-axial fluid collection.

Vascular: No hyperdense vessel or unexpected calcification.

Skull: Normal. Negative for fracture or focal lesion.

Sinuses/Orbits: The visualized paranasal sinuses are clear. Partial
opacification of the right mastoid air cells as seen on the prior
CT. The left mastoid air cells are clear. Bilateral cataract
surgeries.

Other: None

CT CERVICAL SPINE FINDINGS

Alignment: No acute subluxation.

Skull base and vertebrae: No acute fracture. There is osteopenia.
Sclerotic lesion in the T3 vertebra, indeterminate, possibly a bone
island.

Soft tissues and spinal canal: No prevertebral fluid or swelling. No
visible canal hematoma.

Disc levels: Multilevel degenerative disc disease with endplate
irregularity and disc space narrowing.

Upper chest: There is a 9 mm ground-glass nodule in the right upper
lobe (series 7, image 73).

Other: Partially visualized left subclavian central venous line.
IMPRESSION: 1. No acute intracranial hemorrhage.
2. Age-related atrophy chronic microvascular ischemic changes.
3. No acute/traumatic cervical spine pathology.
4. Multilevel degenerative changes of the spine.
5. A 9 mm ground-glass nodule in the right upper lobe. Chest CT may
provide better evaluation of the lungs if clinically indicated.

## 2019-07-26 IMAGING — CR DG HIP (WITH OR WITHOUT PELVIS) 2V BILAT
5 series · 5 of 5 positions shown · non-contrast
Comparison: Pelvis radiographs performed 05/07/2016

CLINICAL DATA: Status post unwitnessed fall, with left hip pain.
Initial encounter.

EXAM:
DG HIP (WITH OR WITHOUT PELVIS) 2V BILAT

[x pelvis (1 of 2)]
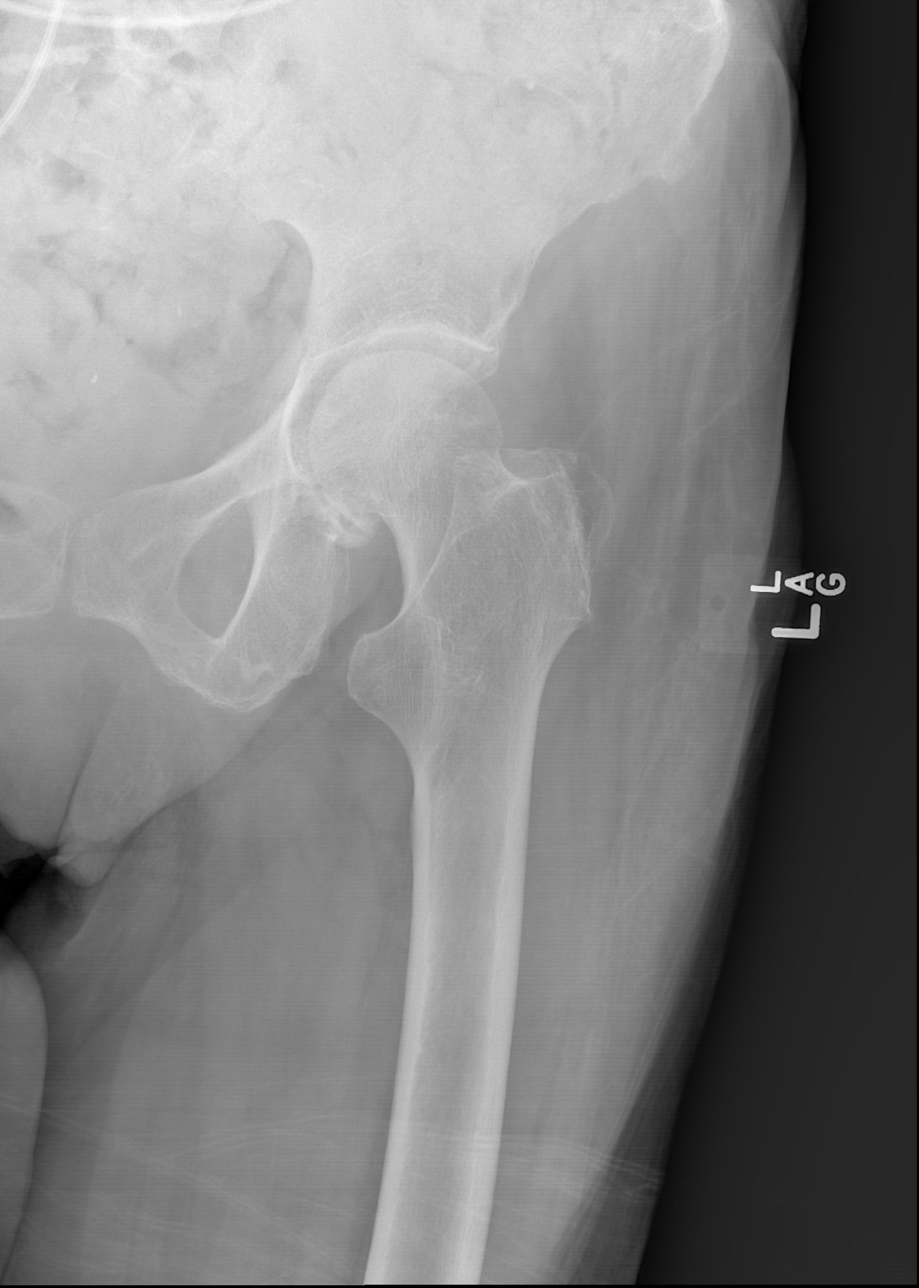

[x hip ap right]
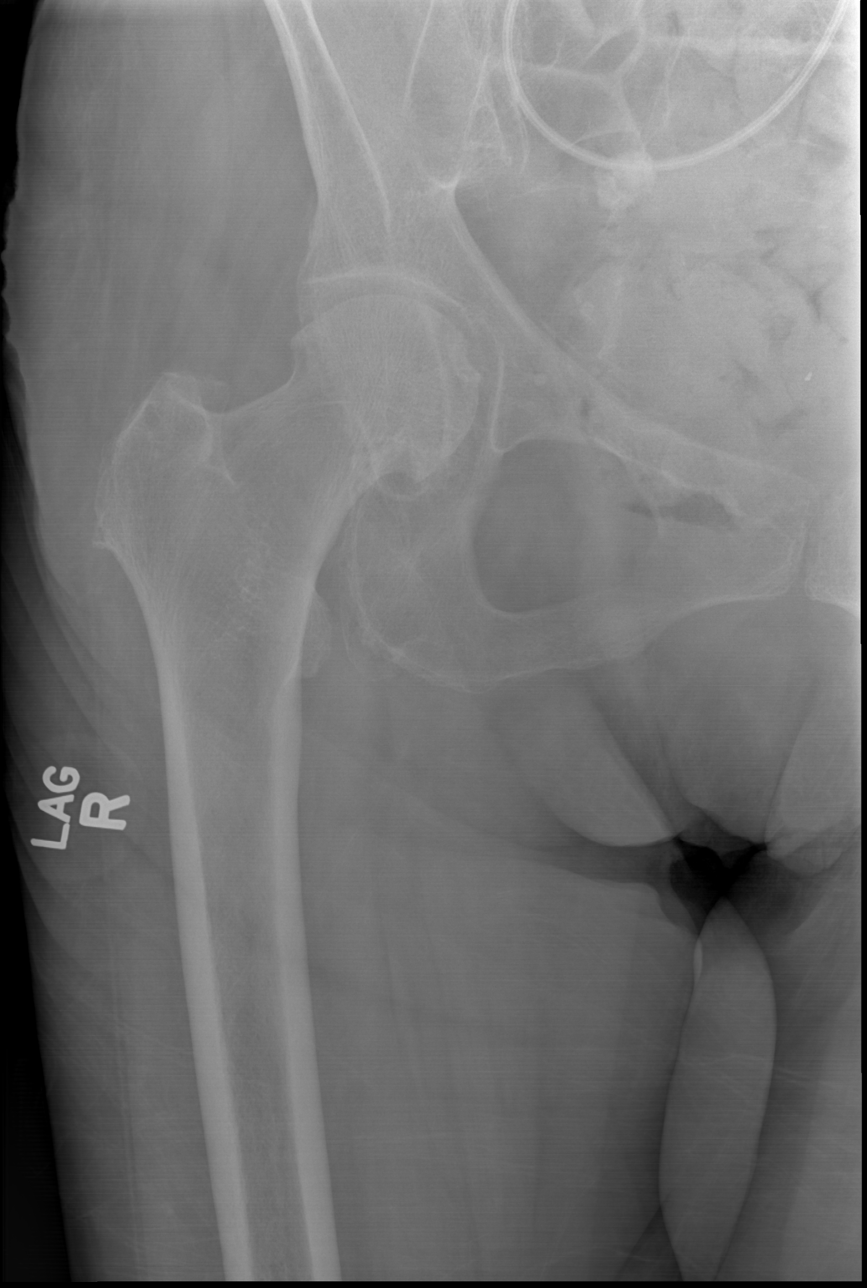

[x pelvis (2 of 2)]
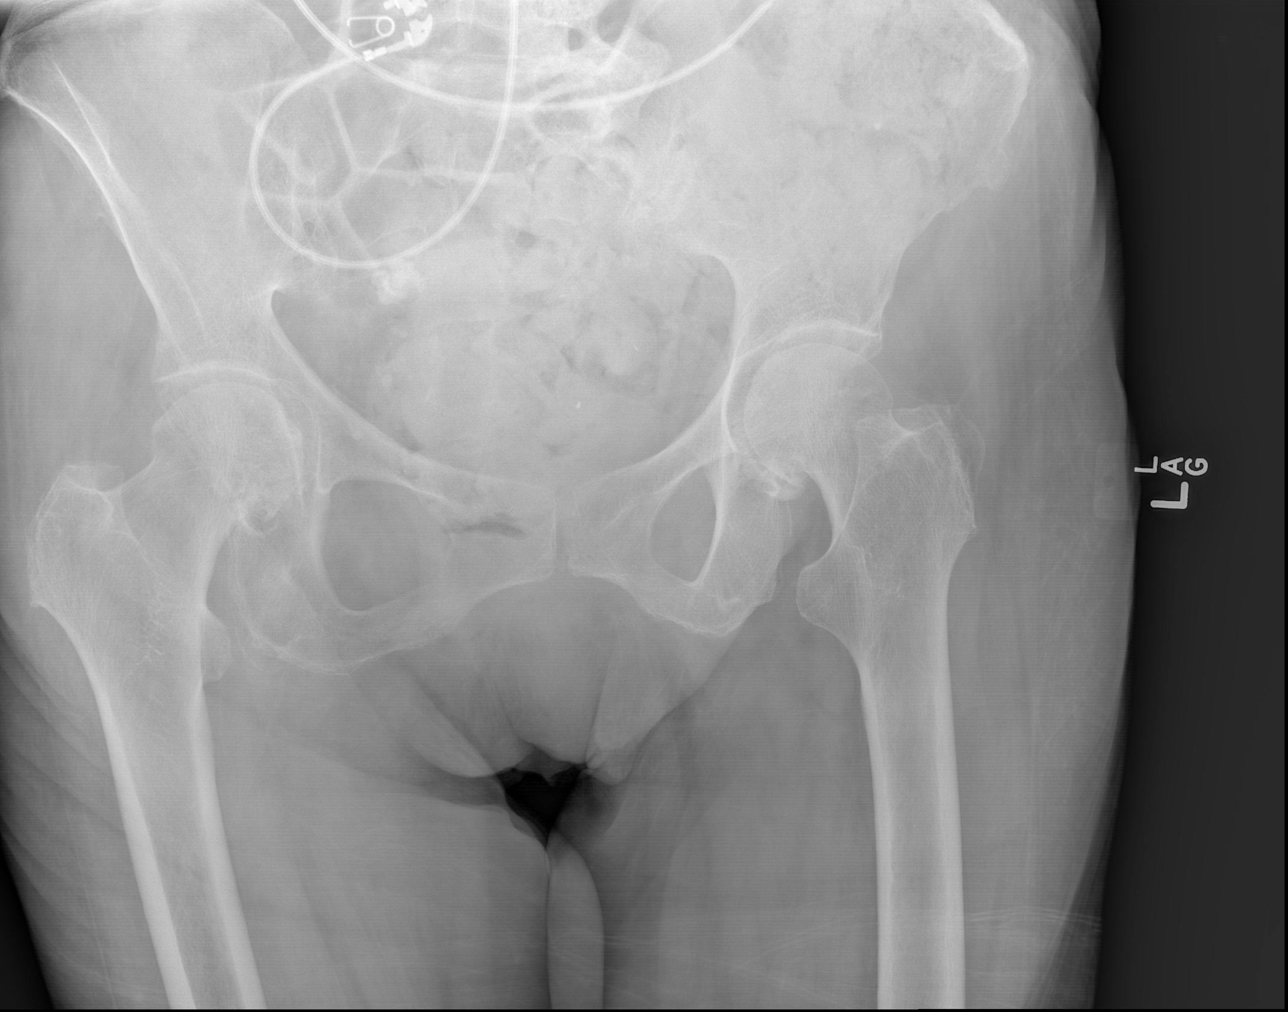

[w hip lat left]
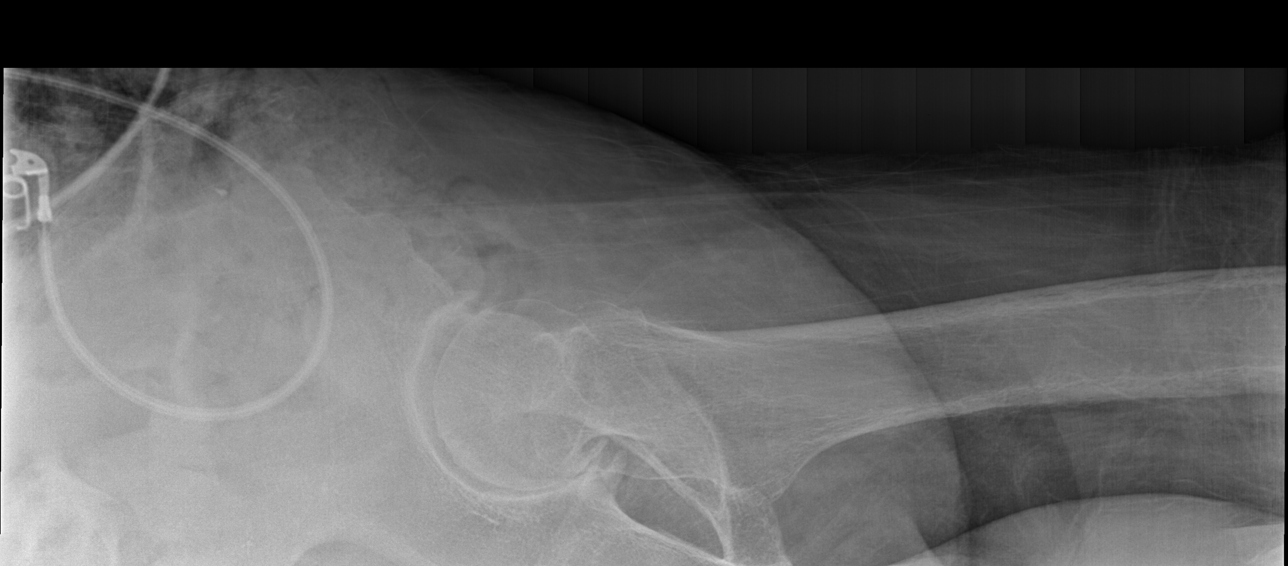

[w hip lat right]
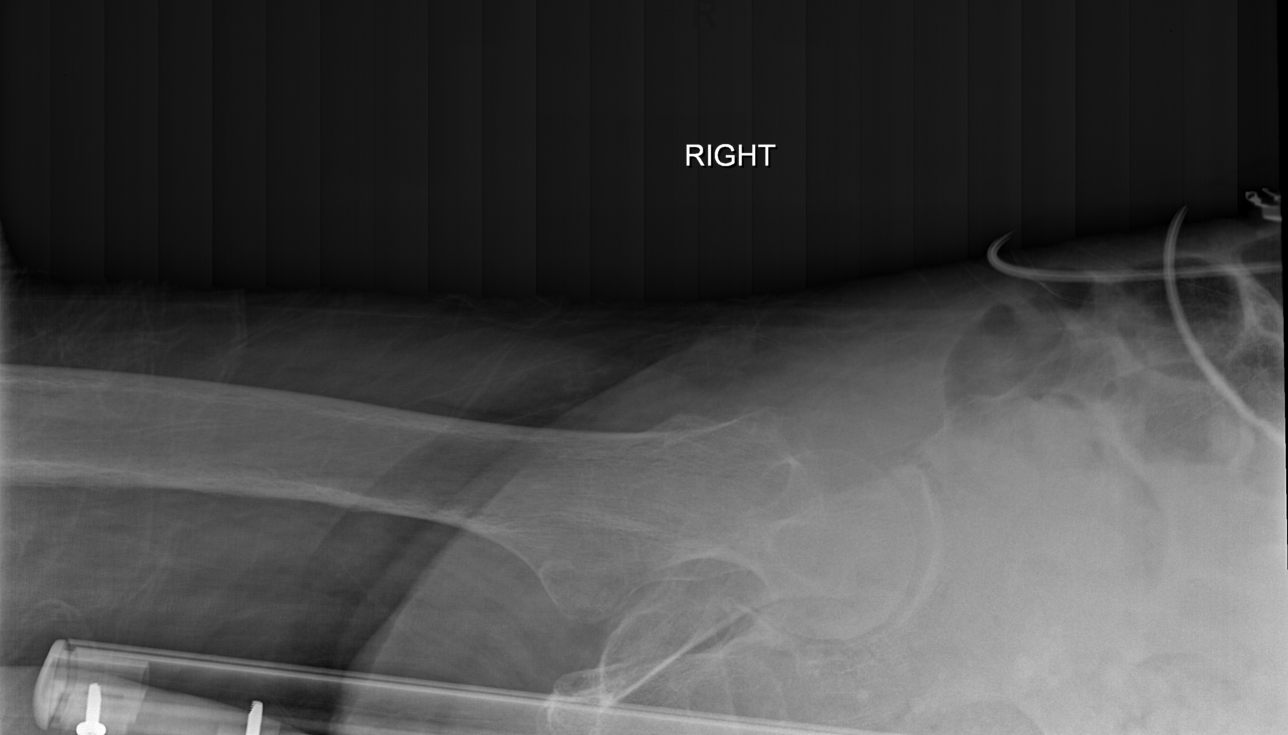

[5 of 5 positions shown; findings below may reference images not displayed]

FINDINGS: There is no evidence of fracture or dislocation. Both femoral heads
are seated normally within their respective acetabula. The proximal
femurs appear intact bilaterally. No significant degenerative change
is appreciated. The sacroiliac joints are unremarkable in
appearance.

The visualized bowel gas pattern is grossly unremarkable in
appearance.
IMPRESSION: No evidence of fracture or dislocation.
